# Patient Record
Sex: Male | Born: 1935 | Race: White | Hispanic: No | Marital: Married | Smoking: Former smoker
Health system: Southern US, Community
[De-identification: ages and names within clinical notes are randomized; demographics above are authoritative.]

## PROBLEM LIST (undated history)

## (undated) DIAGNOSIS — F419 Anxiety disorder, unspecified: Secondary | ICD-10-CM

## (undated) DIAGNOSIS — F0280 Dementia in other diseases classified elsewhere without behavioral disturbance: Secondary | ICD-10-CM

## (undated) DIAGNOSIS — E785 Hyperlipidemia, unspecified: Secondary | ICD-10-CM

## (undated) DIAGNOSIS — M549 Dorsalgia, unspecified: Secondary | ICD-10-CM

## (undated) DIAGNOSIS — T7840XA Allergy, unspecified, initial encounter: Secondary | ICD-10-CM

## (undated) DIAGNOSIS — I6389 Other cerebral infarction: Secondary | ICD-10-CM

## (undated) DIAGNOSIS — I10 Essential (primary) hypertension: Secondary | ICD-10-CM

## (undated) DIAGNOSIS — R42 Dizziness and giddiness: Secondary | ICD-10-CM

## (undated) DIAGNOSIS — G8929 Other chronic pain: Secondary | ICD-10-CM

## (undated) DIAGNOSIS — G309 Alzheimer's disease, unspecified: Secondary | ICD-10-CM

## (undated) HISTORY — DX: Allergy, unspecified, initial encounter: T78.40XA

## (undated) HISTORY — DX: Dorsalgia, unspecified: M54.9

## (undated) HISTORY — DX: Dizziness and giddiness: R42

## (undated) HISTORY — PX: CERVICAL DISC SURGERY: SHX588

## (undated) HISTORY — DX: Alzheimer's disease, unspecified: G30.9

## (undated) HISTORY — DX: Other cerebral infarction: I63.89

## (undated) HISTORY — DX: Hyperlipidemia, unspecified: E78.5

## (undated) HISTORY — DX: Essential (primary) hypertension: I10

## (undated) HISTORY — DX: Other chronic pain: G89.29

## (undated) HISTORY — DX: Anxiety disorder, unspecified: F41.9

## (undated) HISTORY — DX: Dementia in other diseases classified elsewhere, unspecified severity, without behavioral disturbance, psychotic disturbance, mood disturbance, and anxiety: F02.80

---

## 1998-11-15 ENCOUNTER — Encounter: Payer: Self-pay | Admitting: Internal Medicine

## 2002-01-14 ENCOUNTER — Encounter: Payer: Self-pay | Admitting: Internal Medicine

## 2003-10-16 ENCOUNTER — Encounter: Payer: Self-pay | Admitting: Internal Medicine

## 2004-06-26 ENCOUNTER — Ambulatory Visit: Payer: Self-pay | Admitting: Internal Medicine

## 2004-08-16 ENCOUNTER — Ambulatory Visit: Payer: Self-pay | Admitting: Internal Medicine

## 2005-02-18 ENCOUNTER — Ambulatory Visit: Payer: Self-pay | Admitting: Internal Medicine

## 2005-06-11 ENCOUNTER — Ambulatory Visit: Payer: Self-pay | Admitting: Internal Medicine

## 2005-08-21 ENCOUNTER — Ambulatory Visit: Payer: Self-pay | Admitting: Internal Medicine

## 2006-02-18 ENCOUNTER — Ambulatory Visit: Payer: Self-pay | Admitting: Internal Medicine

## 2006-02-24 ENCOUNTER — Ambulatory Visit: Payer: Self-pay | Admitting: Internal Medicine

## 2006-06-02 ENCOUNTER — Ambulatory Visit: Payer: Self-pay | Admitting: Internal Medicine

## 2006-06-23 ENCOUNTER — Ambulatory Visit: Payer: Self-pay | Admitting: Internal Medicine

## 2006-07-07 ENCOUNTER — Ambulatory Visit: Payer: Self-pay | Admitting: Internal Medicine

## 2006-07-31 ENCOUNTER — Ambulatory Visit: Payer: Self-pay | Admitting: Internal Medicine

## 2006-08-21 ENCOUNTER — Ambulatory Visit: Payer: Self-pay | Admitting: Internal Medicine

## 2006-09-21 ENCOUNTER — Ambulatory Visit: Payer: Self-pay | Admitting: Internal Medicine

## 2006-11-23 ENCOUNTER — Ambulatory Visit: Payer: Self-pay | Admitting: Internal Medicine

## 2006-11-26 DIAGNOSIS — H409 Unspecified glaucoma: Secondary | ICD-10-CM | POA: Insufficient documentation

## 2006-11-26 DIAGNOSIS — E785 Hyperlipidemia, unspecified: Secondary | ICD-10-CM

## 2006-11-26 DIAGNOSIS — R42 Dizziness and giddiness: Secondary | ICD-10-CM

## 2006-11-26 DIAGNOSIS — I10 Essential (primary) hypertension: Secondary | ICD-10-CM | POA: Insufficient documentation

## 2006-11-26 DIAGNOSIS — J309 Allergic rhinitis, unspecified: Secondary | ICD-10-CM | POA: Insufficient documentation

## 2006-12-28 ENCOUNTER — Ambulatory Visit: Payer: Self-pay | Admitting: Internal Medicine

## 2007-01-25 ENCOUNTER — Inpatient Hospital Stay: Payer: Self-pay | Admitting: Internal Medicine

## 2007-01-25 ENCOUNTER — Other Ambulatory Visit: Payer: Self-pay

## 2007-01-28 ENCOUNTER — Encounter: Payer: Self-pay | Admitting: Internal Medicine

## 2007-02-02 ENCOUNTER — Ambulatory Visit: Payer: Self-pay | Admitting: Internal Medicine

## 2007-02-05 ENCOUNTER — Ambulatory Visit: Payer: Self-pay | Admitting: *Deleted

## 2007-02-10 ENCOUNTER — Encounter (INDEPENDENT_AMBULATORY_CARE_PROVIDER_SITE_OTHER): Payer: Self-pay | Admitting: *Deleted

## 2007-02-25 ENCOUNTER — Encounter (INDEPENDENT_AMBULATORY_CARE_PROVIDER_SITE_OTHER): Payer: Self-pay | Admitting: *Deleted

## 2007-03-03 ENCOUNTER — Ambulatory Visit: Payer: Self-pay | Admitting: Internal Medicine

## 2007-03-04 LAB — CONVERTED CEMR LAB
Albumin: 4.1 g/dL (ref 3.5–5.2)
CO2: 34 meq/L — ABNORMAL HIGH (ref 19–32)
Creatinine, Ser: 0.8 mg/dL (ref 0.4–1.5)
LDL Cholesterol: 125 mg/dL — ABNORMAL HIGH (ref 0–99)
Phosphorus: 3.5 mg/dL (ref 2.3–4.6)
Potassium: 4.7 meq/L (ref 3.5–5.1)
Sodium: 142 meq/L (ref 135–145)
Total CHOL/HDL Ratio: 4.2
Triglycerides: 99 mg/dL (ref 0–149)

## 2007-03-21 ENCOUNTER — Emergency Department: Payer: Self-pay | Admitting: Emergency Medicine

## 2007-03-21 ENCOUNTER — Other Ambulatory Visit: Payer: Self-pay

## 2007-03-23 ENCOUNTER — Ambulatory Visit: Payer: Self-pay | Admitting: Internal Medicine

## 2007-06-28 ENCOUNTER — Ambulatory Visit: Payer: Self-pay | Admitting: Internal Medicine

## 2007-06-29 ENCOUNTER — Telehealth (INDEPENDENT_AMBULATORY_CARE_PROVIDER_SITE_OTHER): Payer: Self-pay | Admitting: *Deleted

## 2007-06-30 LAB — CONVERTED CEMR LAB
ALT: 17 units/L (ref 0–53)
AST: 22 units/L (ref 0–37)
Alkaline Phosphatase: 89 units/L (ref 39–117)
Basophils Relative: 0 % (ref 0.0–1.0)
Chloride: 99 meq/L (ref 96–112)
Eosinophils Relative: 1.5 % (ref 0.0–5.0)
GFR calc Af Amer: 83 mL/min
Glucose, Bld: 117 mg/dL — ABNORMAL HIGH (ref 70–99)
HCT: 44.1 % (ref 39.0–52.0)
Phosphorus: 3.1 mg/dL (ref 2.3–4.6)
Platelets: 219 10*3/uL (ref 150–400)
Potassium: 4.5 meq/L (ref 3.5–5.1)
RBC: 4.83 M/uL (ref 4.22–5.81)
RDW: 12.7 % (ref 11.5–14.6)
Sodium: 137 meq/L (ref 135–145)
Total Bilirubin: 1 mg/dL (ref 0.3–1.2)
Total CHOL/HDL Ratio: 4.1
Total Protein: 8.2 g/dL (ref 6.0–8.3)
Triglycerides: 99 mg/dL (ref 0–149)
VLDL: 20 mg/dL (ref 0–40)
Vitamin B-12: 310 pg/mL (ref 211–911)
WBC: 4.7 10*3/uL (ref 4.5–10.5)

## 2007-07-13 ENCOUNTER — Telehealth (INDEPENDENT_AMBULATORY_CARE_PROVIDER_SITE_OTHER): Payer: Self-pay | Admitting: *Deleted

## 2007-07-14 ENCOUNTER — Ambulatory Visit: Payer: Self-pay | Admitting: Internal Medicine

## 2007-07-14 ENCOUNTER — Telehealth (INDEPENDENT_AMBULATORY_CARE_PROVIDER_SITE_OTHER): Payer: Self-pay | Admitting: *Deleted

## 2007-07-28 ENCOUNTER — Encounter: Payer: Self-pay | Admitting: Internal Medicine

## 2007-07-29 ENCOUNTER — Telehealth: Payer: Self-pay | Admitting: Internal Medicine

## 2007-08-02 ENCOUNTER — Ambulatory Visit: Payer: Self-pay | Admitting: Internal Medicine

## 2007-08-04 ENCOUNTER — Telehealth: Payer: Self-pay | Admitting: Internal Medicine

## 2007-08-26 ENCOUNTER — Telehealth: Payer: Self-pay | Admitting: Internal Medicine

## 2007-09-16 ENCOUNTER — Ambulatory Visit: Payer: Self-pay | Admitting: Internal Medicine

## 2007-11-08 ENCOUNTER — Encounter: Payer: Self-pay | Admitting: Internal Medicine

## 2007-12-31 ENCOUNTER — Encounter: Payer: Self-pay | Admitting: *Deleted

## 2007-12-31 ENCOUNTER — Encounter (INDEPENDENT_AMBULATORY_CARE_PROVIDER_SITE_OTHER): Payer: Self-pay | Admitting: *Deleted

## 2007-12-31 ENCOUNTER — Ambulatory Visit: Payer: Self-pay | Admitting: *Deleted

## 2008-01-11 ENCOUNTER — Ambulatory Visit: Payer: Self-pay | Admitting: Internal Medicine

## 2008-01-12 LAB — CONVERTED CEMR LAB
BUN: 20 mg/dL (ref 6–23)
Bilirubin, Direct: 0.1 mg/dL (ref 0.0–0.3)
Chloride: 104 meq/L (ref 96–112)
Eosinophils Absolute: 0.3 10*3/uL (ref 0.0–0.7)
Glucose, Bld: 98 mg/dL (ref 70–99)
HCT: 42 % (ref 39.0–52.0)
HDL: 45.7 mg/dL (ref >39.0)
Monocytes Absolute: 0.6 10*3/uL (ref 0.1–1.0)
Monocytes Relative: 8.7 % (ref 3.0–12.0)
Platelets: 247 10*3/uL (ref 150–400)
Potassium: 4.2 meq/L (ref 3.5–5.1)
RDW: 12.3 % (ref 11.5–14.6)
TSH: 2.68 microintl units/mL (ref 0.35–5.50)
Total Bilirubin: 1.2 mg/dL (ref 0.3–1.2)
Total CHOL/HDL Ratio: 3.7
Triglycerides: 116 mg/dL (ref 0–149)

## 2008-01-24 ENCOUNTER — Encounter: Payer: Self-pay | Admitting: Internal Medicine

## 2008-01-25 DIAGNOSIS — F411 Generalized anxiety disorder: Secondary | ICD-10-CM | POA: Insufficient documentation

## 2008-01-25 DIAGNOSIS — I6350 Cerebral infarction due to unspecified occlusion or stenosis of unspecified cerebral artery: Secondary | ICD-10-CM | POA: Insufficient documentation

## 2008-04-26 ENCOUNTER — Ambulatory Visit: Payer: Self-pay | Admitting: Internal Medicine

## 2008-04-26 ENCOUNTER — Encounter: Payer: Self-pay | Admitting: Internal Medicine

## 2008-06-23 ENCOUNTER — Ambulatory Visit: Payer: Self-pay | Admitting: Internal Medicine

## 2008-06-23 DIAGNOSIS — R413 Other amnesia: Secondary | ICD-10-CM

## 2008-06-26 LAB — CONVERTED CEMR LAB
ALT: 23 units/L (ref 0–53)
BUN: 20 mg/dL (ref 6–23)
Bilirubin, Direct: 0.1 mg/dL (ref 0.0–0.3)
Calcium: 9.5 mg/dL (ref 8.4–10.5)
Eosinophils Relative: 3.9 % (ref 0.0–5.0)
GFR calc Af Amer: 93 mL/min
HCT: 42.1 % (ref 39.0–52.0)
Hemoglobin: 14.6 g/dL (ref 13.0–17.0)
Monocytes Absolute: 0.5 10*3/uL (ref 0.1–1.0)
Monocytes Relative: 7.2 % (ref 3.0–12.0)
Neutro Abs: 4.6 10*3/uL (ref 1.4–7.7)
Phosphorus: 3.3 mg/dL (ref 2.3–4.6)
Potassium: 4.4 meq/L (ref 3.5–5.1)
Sodium: 143 meq/L (ref 135–145)
TSH: 2.5 microintl units/mL (ref 0.35–5.50)
Total Protein: 8 g/dL (ref 6.0–8.3)
WBC: 7.1 10*3/uL (ref 4.5–10.5)

## 2008-07-03 ENCOUNTER — Telehealth: Payer: Self-pay | Admitting: Internal Medicine

## 2008-07-21 ENCOUNTER — Telehealth: Payer: Self-pay | Admitting: Internal Medicine

## 2008-07-25 ENCOUNTER — Ambulatory Visit: Payer: Self-pay | Admitting: Internal Medicine

## 2008-07-26 LAB — CONVERTED CEMR LAB
ALT: 29 units/L (ref 0–53)
AST: 27 units/L (ref 0–37)
Albumin: 4.1 g/dL (ref 3.5–5.2)
Alkaline Phosphatase: 79 units/L (ref 39–117)
BUN: 28 mg/dL — ABNORMAL HIGH (ref 6–23)
Basophils Relative: 1.1 % (ref 0.0–3.0)
Bilirubin, Direct: 0.1 mg/dL (ref 0.0–0.3)
Calcium: 9.7 mg/dL (ref 8.4–10.5)
Creatinine, Ser: 0.9 mg/dL (ref 0.4–1.5)
Eosinophils Absolute: 0.2 10*3/uL (ref 0.0–0.7)
Eosinophils Relative: 3.7 % (ref 0.0–5.0)
GFR calc Af Amer: 105 mL/min
GFR calc non Af Amer: 87 mL/min
Lymphocytes Relative: 22.4 % (ref 12.0–46.0)
MCV: 91.5 fL (ref 78.0–100.0)
Monocytes Relative: 8.5 % (ref 3.0–12.0)
Neutrophils Relative %: 64.3 % (ref 43.0–77.0)
RBC: 4.43 M/uL (ref 4.22–5.81)
Total CHOL/HDL Ratio: 3.7
Total Protein: 7.5 g/dL (ref 6.0–8.3)
VLDL: 16 mg/dL (ref 0–40)
WBC: 6.6 10*3/uL (ref 4.5–10.5)

## 2008-08-31 ENCOUNTER — Encounter: Payer: Self-pay | Admitting: Internal Medicine

## 2009-01-04 ENCOUNTER — Encounter: Payer: Self-pay | Admitting: Internal Medicine

## 2009-01-04 ENCOUNTER — Ambulatory Visit: Payer: Self-pay | Admitting: Internal Medicine

## 2009-01-04 DIAGNOSIS — I499 Cardiac arrhythmia, unspecified: Secondary | ICD-10-CM | POA: Insufficient documentation

## 2009-01-11 ENCOUNTER — Ambulatory Visit: Payer: Self-pay | Admitting: Internal Medicine

## 2009-01-30 ENCOUNTER — Encounter: Payer: Self-pay | Admitting: Internal Medicine

## 2009-05-03 ENCOUNTER — Encounter: Payer: Self-pay | Admitting: Internal Medicine

## 2009-05-03 ENCOUNTER — Ambulatory Visit: Payer: Self-pay | Admitting: Internal Medicine

## 2009-05-14 ENCOUNTER — Encounter: Payer: Self-pay | Admitting: Internal Medicine

## 2009-07-11 ENCOUNTER — Encounter: Payer: Self-pay | Admitting: Internal Medicine

## 2010-01-21 ENCOUNTER — Ambulatory Visit: Payer: Self-pay | Admitting: Internal Medicine

## 2010-01-23 LAB — CONVERTED CEMR LAB
AST: 27 units/L (ref 0–37)
Albumin: 4.3 g/dL (ref 3.5–5.2)
Alkaline Phosphatase: 84 units/L (ref 39–117)
Basophils Relative: 0.4 % (ref 0.0–3.0)
CO2: 26 meq/L (ref 19–32)
Calcium: 9.6 mg/dL (ref 8.4–10.5)
Chloride: 106 meq/L (ref 96–112)
Creatinine, Ser: 1.2 mg/dL (ref 0.4–1.5)
Eosinophils Absolute: 0.1 10*3/uL (ref 0.0–0.7)
Eosinophils Relative: 0.6 % (ref 0.0–5.0)
Glucose, Bld: 123 mg/dL — ABNORMAL HIGH (ref 70–99)
HCT: 42.1 % (ref 39.0–52.0)
LDL Cholesterol: 127 mg/dL — ABNORMAL HIGH (ref 0–99)
Lymphs Abs: 1.3 10*3/uL (ref 0.7–4.0)
MCHC: 34.2 g/dL (ref 30.0–36.0)
MCV: 92.5 fL (ref 78.0–100.0)
Monocytes Absolute: 0.5 10*3/uL (ref 0.1–1.0)
Neutrophils Relative %: 79.2 % — ABNORMAL HIGH (ref 43.0–77.0)
RBC: 4.55 M/uL (ref 4.22–5.81)
Sodium: 142 meq/L (ref 135–145)
Total Bilirubin: 0.7 mg/dL (ref 0.3–1.2)
Total CHOL/HDL Ratio: 4
WBC: 9.5 10*3/uL (ref 4.5–10.5)

## 2010-02-25 ENCOUNTER — Inpatient Hospital Stay: Payer: BLUE CROSS/BLUE SHIELD | Admitting: Internal Medicine

## 2010-02-25 ENCOUNTER — Encounter: Payer: Self-pay | Admitting: Internal Medicine

## 2010-02-25 ENCOUNTER — Ambulatory Visit: Payer: Self-pay | Admitting: Cardiovascular Disease

## 2010-02-26 ENCOUNTER — Encounter: Payer: Self-pay | Admitting: Internal Medicine

## 2010-03-06 ENCOUNTER — Ambulatory Visit: Payer: Self-pay | Admitting: Internal Medicine

## 2010-03-13 ENCOUNTER — Encounter: Payer: Self-pay | Admitting: Internal Medicine

## 2010-04-22 ENCOUNTER — Ambulatory Visit: Payer: Self-pay | Admitting: Internal Medicine

## 2010-04-22 DIAGNOSIS — F0280 Dementia in other diseases classified elsewhere without behavioral disturbance: Secondary | ICD-10-CM

## 2010-04-22 DIAGNOSIS — G309 Alzheimer's disease, unspecified: Secondary | ICD-10-CM

## 2010-05-27 ENCOUNTER — Ambulatory Visit: Payer: Self-pay | Admitting: Internal Medicine

## 2010-06-04 ENCOUNTER — Ambulatory Visit: Payer: Self-pay | Admitting: Family Medicine

## 2010-06-04 ENCOUNTER — Telehealth: Payer: Self-pay | Admitting: Internal Medicine

## 2010-06-05 ENCOUNTER — Encounter: Payer: Self-pay | Admitting: Internal Medicine

## 2010-06-05 ENCOUNTER — Telehealth: Payer: Self-pay | Admitting: Internal Medicine

## 2010-06-05 LAB — CONVERTED CEMR LAB
BUN: 28 mg/dL — ABNORMAL HIGH (ref 6–23)
CO2: 26 meq/L (ref 19–32)
Calcium: 9.8 mg/dL (ref 8.4–10.5)
Creatinine, Ser: 0.8 mg/dL (ref 0.4–1.5)
GFR calc non Af Amer: 97.43 mL/min (ref >60)
Glucose, Bld: 103 mg/dL — ABNORMAL HIGH (ref 70–99)

## 2010-06-12 ENCOUNTER — Telehealth (INDEPENDENT_AMBULATORY_CARE_PROVIDER_SITE_OTHER): Payer: Self-pay | Admitting: *Deleted

## 2010-07-01 ENCOUNTER — Ambulatory Visit: Payer: Self-pay | Admitting: Internal Medicine

## 2010-07-02 LAB — CONVERTED CEMR LAB
Albumin: 3.9 g/dL (ref 3.5–5.2)
BUN: 30 mg/dL — ABNORMAL HIGH (ref 6–23)
Creatinine, Ser: 0.9 mg/dL (ref 0.4–1.5)
GFR calc non Af Amer: 83.06 mL/min (ref >60)
Glucose, Bld: 124 mg/dL — ABNORMAL HIGH (ref 70–99)
Phosphorus: 2.3 mg/dL (ref 2.3–4.6)

## 2010-07-12 ENCOUNTER — Encounter: Payer: Self-pay | Admitting: Internal Medicine

## 2010-08-06 ENCOUNTER — Telehealth: Payer: Self-pay | Admitting: Internal Medicine

## 2010-09-10 NOTE — Progress Notes (Signed)
Summary: reporting BP readings  Phone Note Call from Patient Call back at Home Phone [phone]   Caller: Daughter  Gwenlyn Saran    Call For: Cindee Salt MD Summary of Call: Pt's daughter reports BP readings of 157/107, 129/85, 144/88, 138/79, 139/80 over the past week, starting with last wednesday.  She said he is taking his meds ok for now, they will keep a watch on that. Initial call taken by: Lowella Petties CMA, AAMA,  June 12, 2010 5:00 PM  Follow-up for Phone Call        Let her know that these are okay overall No need for any changes now Follow-up by: Cindee Salt MD,  June 12, 2010 5:32 PM  Additional Follow-up for Phone Call Additional follow up Details #1::        Left message on Debra's home voicemail to return call. Natasha Chavers CMA Duncan Dull)  June 13, 2010 11:15 AM I advised daughter Liane Comber CMA Duncan Dull)  June 14, 2010 9:30 AM'

## 2010-09-10 NOTE — Assessment & Plan Note (Signed)
Summary: DIZZY   Vital Signs:  Patient profile:   77 year old male Weight:      162.75 pounds Temp:     97.6 degrees F oral Pulse rate:   72 / minute Pulse (ortho):   80 / minute Pulse rhythm:   regular BP sitting:   180 / 100  (left arm) BP standing:   176 / 100 Cuff size:   large  Vitals Entered By: Selena Batten Dance CMA Duncan Dull) (June 04, 2010 10:01 AM)  Serial Vital Signs/Assessments:  Time      Position  BP       Pulse  Resp  Temp     By 10:12 AM  Lying LA  182/102  82                    Kim Dance CMA (AAMA) 10:12 AM  Sitting   180/100  80                    Kim Dance CMA (AAMA) 10:12 AM  Standing  176/100  80                    Kim Dance CMA (AAMA)  CC: Dizzy last PM   History of Present Illness: CC: dizzy  Presents with daughter Eunice Blase who helps give history (pt very hard of hearing).  2 nights ago having dinner, got very dizzy.  Placed in wheelchair, went to bed, vomited.  BP checked and 211 systolic.  No longer dizzy.  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  describes dizziness as vertigo.  No LOC, HA, vision changes, CP/tightness/SOB, slurred speech, one-sided weakness.  Lasted 35 min.  None since.  Daughter brings up concerns - Lives at [facility] independent living.  Daughter notes having trouble with showering and other ADLs.  Unsure if taking meds right (mezlicine and bp med mainly).  recently increased donepezil dose to 10 mg daily.  Receives weekly filled pill box from [facility], daughter states patient takes meds in am and pm, but she is unsure really what he is taking or has been wondering if he actually remembers to take them, or maybe taking too many.  No one at [facility] that can supervise [place] for med administration.  Wondering if may need to look into ALF, financial issues.  awaiting [employer] benefits in next few months.  Current Medications (verified): 1)  Donepezil Hcl 5 Mg Tabs (Donepezil Hcl) .Marland Kitchen.. 1 Tab Daily To  Help Memory 2)  Lisinopril 10 Mg Tabs (Lisinopril) .Marland Kitchen.. 1 Once Daily 3)  Lovastatin 40 Mg  Tabs (Lovastatin) .... Take 1 Tablet By Mouth Once A Day 4)  Sertraline Hcl 100 Mg Tabs (Sertraline Hcl) .Marland Kitchen.. 1 Daily 5)  Xalatan   Soln (Latanoprost Soln) .Marland Kitchen.. 1 Drop in Each Eye Once Daily 6)  Timolol Maleate 0.25 %  Soln (Timolol Maleate) .... Use One Drop in Each Eye Daily 7)  Aspirin Ec 81 Mg  Tbec (Aspirin) .... Take 1 Tab Daily. 8)  Antivert 25 Mg Tabs (Meclizine Hcl) .Marland Kitchen.. 1 Three Times A Day As Needed 9)  Donepezil Hcl 10 Mg Tabs (Donepezil Hcl) .Marland Kitchen.. 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  hard of hearing Head:  Normocephalic and atraumatic without obvious abnormalities. No apparent alopecia or balding. Eyes:  No corneal or conjunctival inflammation noted. EOMI. Perrla.  Ears:  External ear exam shows no significant lesions or deformities.  Otoscopic examination reveals clear canals, tympanic membranes are intact bilaterally without bulging, retraction, inflammation or discharge. Hearing is grossly normal bilaterally.  hearing aid in R ear, no hearing out of left ear Mouth:  Oral mucosa and oropharynx without lesions or exudates.  Teeth in good repair. Neck:  supple, no masses, no thyromegaly, and no cervical lymphadenopathy.  no bruits Lungs:  normal respiratory effort, no intercostal retractions, no accessory muscle use, and normal breath  sounds.   Heart:  normal rate, regular rhythm, no murmur, and no gallop.   Neurologic:  CN 2-12 intact x hearing, sensation and strength intact BUE/LE.   Skin:  no rashes and no suspicious lesions.     Impression & Recommendations:  Problem # 1:  VERTIGO (ICD-780.4) likely stemming from previous CVA, continue meclizine.  BP elevation liekly from acute episode of not feeling well.  However, remaining high.  see below.  no evidence of residual deficit to be concerned with new CVA  His updated medication list for this problem includes:    Antivert 25 Mg Tabs (Meclizine hcl) .Marland Kitchen... 1 three times a day as needed  Problem # 2:  HYPERTENSION (ICD-401.9) reviewing chart, has had elevated BPs in past.  today 180/100, doesn't drop.  will start HCTZ 12.5mg  daily as well as lisinopril.  hopeful for improvement, but dont' want to be too aggressive for control.  will defer to PCP.  asked daughter to keep log of BPs a few times a week over next few weeks and to call us next week with update.  asked to return in 2-3 wks for f/u, bring pill bottle with him.  His updated medication list for this problem includes:    Lisinopril 10 Mg Tabs (Lisinopril) .Marland Kitchen... 1 once daily    Hydrochlorothiazide 12.5 Mg Caps (Hydrochlorothiazide) .Marland Kitchen... Take one daily for blood pressure  BP today: 180/100 Prior BP: 158/80 (05/27/2010)  Labs Reviewed: K+: 4.0 (01/21/2010) Creat: : 1.2 (01/21/2010)   Chol: 196 (01/21/2010)   HDL: 48.40 (01/21/2010)   LDL: 127 (01/21/2010)   TG: 105.0 (01/21/2010)  Orders: TLB-BMP (Basic Metabolic Panel-BMET) (80048-METABOL)  Problem # 3:  ALZHEIMERS DISEASE (ICD-331.0) backed down to 5mg  dose given concerns about medication administration.  currently both on med list, removed 10mg .  family may very well need to reassess level of care.  will defer to PCP.  Complete Medication List: 1)  Donepezil Hcl 5 Mg Tabs (Donepezil hcl) .Marland Kitchen.. 1 tab daily to help memory 2)  Lisinopril 10 Mg Tabs  (Lisinopril) .Marland Kitchen.. 1 once daily 3)  Lovastatin 40 Mg Tabs (Lovastatin) .... Take 1 tablet by mouth once a day 4)  Sertraline Hcl 100 Mg Tabs (Sertraline hcl) .Marland Kitchen.. 1 daily 5)  Xalatan Soln (Latanoprost soln) .Marland Kitchen.. 1 drop in each eye once daily 6)  Timolol Maleate 0.25 % Soln (Timolol maleate) .... Use one drop in each eye daily 7)  Aspirin Ec 81 Mg Tbec (Aspirin) .... Take 1 tab daily. 8)  Antivert 25 Mg  Tabs (Meclizine hcl) .Marland Kitchen.. 1 three times a day as needed 9)  Hydrochlorothiazide 12.5 Mg Caps (Hydrochlorothiazide) .... Take one daily for blood pressure  Patient Instructions: 1)  Back down to 5mg  aricept (donepezil). 2)  Next time you come in, bring pills you are taking daily. 3)  Check blood pressure next week several times.  we want it less than 150/90.  Call us with results. 4)  we will start new medicine called hydrochlorothiazide 12.5mg  daily, take with lisinopril daily. Prescriptions: HYDROCHLOROTHIAZIDE 12.5 MG CAPS (HYDROCHLOROTHIAZIDE) take one daily for blood pressure  #30 x 3   Entered and Authorized by:   Eustaquio Boyden  MD   Signed by:   Eustaquio Boyden  MD on 06/04/2010   Method used:   Electronically to        [facility] [phone]* (retail)       [address] [place], [place]  [address]       Ph: [phone]       Fax: [phone]   RxID:   [id number]    Orders Added: 1)  TLB-BMP (Basic Metabolic Panel-BMET) [80048-METABOL] 2)  Est. Patient Level IV [38756]    Current Allergies (reviewed today): PENICILLIN G POTASSIUM (PENICILLIN G POTASSIUM) PAROXETINE HCL (PAROXETINE HCL)

## 2010-09-10 NOTE — Letter (Signed)
[facility]   Imported By: Beau Fanny 03/01/2010 15:22:46  _____________________________________________________________________  External Attachment:    Type:   Image     Comment:   External Document

## 2010-09-10 NOTE — Assessment & Plan Note (Signed)
Summary: HOSPITAL F/U [facility] DISCHARGED 7/19/DLO   Vital Signs:  Patient profile:   77 year old male Weight:      170.38 pounds Temp:     97.7 degrees F oral Pulse rate:   54 / minute Pulse rhythm:   regular BP sitting:   180 / 86  (left arm) Cuff size:   regular  Vitals Entered By: Janee Morn CMA (March 06, 2010 12:38 PM) CC: Hospiptal follow up   History of Present Illness: Hospitalized with dizziness and ataxia work up was negative feeling fine now  Doing well Stll happy at [facility] Still drives but very little---daughter drove him here today Most he does is [employer] down the road  No new concerns  No chest pain  No headaches No edema No trouble breathing  Daughter concerned about memory  Voiding okay no trouble with bowels  Allergies: 1)  Penicillin G Potassium (Penicillin G Potassium) 2)  Paroxetine Hcl (Paroxetine Hcl)  Past History:  Past medical, surgical, family and social histories (including risk factors) reviewed for relevance to current acute and chronic problems.  Past Medical History: Reviewed history from 06/23/2008 and no changes required. 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 1970 Pilonidal cyst 1975 Diskectomy  [place] vertigo / dizziness  12/2001 9/00 Brainstem CVA--vertigo  Family History: Reviewed history from 11/26/2006 and no changes required. Dad died @64  ??prostate cancer Mom died @81  respiratory arrest No CAD  Social History: Reviewed history from 08/02/2007 and no changes required. 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     Neck:  supple, no masses, no thyromegaly, and no cervical lymphadenopathy.   Lungs:  normal respiratory effort, no intercostal retractions, no accessory muscle use, and normal breath sounds.   Heart:  normal rate, regular rhythm, no murmur, and no gallop.   Extremities:  no edema Psych:  normally interactive, good eye contact, not anxious appearing, and not depressed appearing.     Impression & Recommendations:  Problem # 1:  VERTIGO (ICD-780.4) Assessment Deteriorated  recent hospitalization and marked worsening of hearing will have ENT eval  His updated medication list for this problem includes:    Antivert 25 Mg Tabs (Meclizine hcl) .Marland Kitchen... 1 three times a day as needed  Orders: ENT Referral (ENT)  Problem # 2:  HYPERTENSION (ICD-401.9) Assessment: Unchanged up some today recheck on right 162/106 will recheck this at follow up  The following medications were removed from the medication list:    Amlodipine Besylate 5 Mg Tabs (Amlodipine besylate) .Marland Kitchen... 1 daily His updated medication list for this problem includes:    Lisinopril 10 Mg Tabs (Lisinopril) .Marland Kitchen... 1 once daily  BP today: 180/86 Prior BP: 160/90 (01/21/2010)  Labs Reviewed: K+: 4.0 (01/21/2010) Creat: : 1.2 (01/21/2010)   Chol: 196 (01/21/2010)   HDL: 48.40 (01/21/2010)   LDL: 127 (01/21/2010)   TG: 105.0 (01/21/2010)  Problem # 3:  MEMORY LOSS (ICD-780.93) Assessment: Comment Only daughter concerned about worsened memory loss may be related to worsened hearing now he seems his usual apporpriate self except for hearing daughter is concerned  about him not allowing her to wash his clothes, etc discussed  that it might be good to go back to [facility]  Complete Medication List: 1)  Mevacor 40 Mg Tabs (Lovastatin) .Marland Kitchen.. 1 once daily 2)  Lisinopril 10 Mg Tabs (Lisinopril) .Marland Kitchen.. 1 once daily 3)  Lovastatin 40 Mg Tabs (Lovastatin) .... Take 1 tablet by mouth once a day 4)  Sertraline Hcl 100 Mg Tabs (Sertraline  hcl) .Marland Kitchen.. 1 daily 5)  Xalatan Soln (Latanoprost soln) .Marland Kitchen.. 1 gtt ou daily 6)  Timolol Maleate 0.25 % Soln (Timolol maleate) .... Use one drop in each eye daily 7)  Aspirin Ec 81 Mg Tbec (Aspirin) .... Take 1 tab daily. 8)  Antivert 25 Mg Tabs (Meclizine hcl) .Marland Kitchen.. 1 three times a day as needed  Patient Instructions: 1)  Please schedule a follow-up appointment in 4-6  weeks.   Current Allergies (reviewed today): PENICILLIN G POTASSIUM (PENICILLIN G POTASSIUM) PAROXETINE HCL (PAROXETINE HCL)

## 2010-09-10 NOTE — Letter (Signed)
Summary: Paperwork for [employer]   Paperwork for [employer]   Imported By: Lanelle Bal 06/12/2010 10:44:42  _____________________________________________________________________  External Attachment:    Type:   Image     Comment:   External Document

## 2010-09-10 NOTE — Progress Notes (Signed)
Summary: wants order for walker  Phone Note Call from Patient Call back at Home Phone [phone]   Caller: Daughter  Stanton Kidney Summary of Call: Pt wants an order for a walker with a seat and brakes sent to [facility].  Order must include diagnosis code.  Please let pt's daughter know when done. Initial call taken by: Lowella Petties CMA, AAMA,  June 04, 2010 4:50 PM  Follow-up for Phone Call        Rx written Follow-up by: Cindee Salt MD,  June 05, 2010 7:58 AM  Additional Follow-up for Phone Call Additional follow up Details #1::        order faxed to [facility], left message on daughter's voicemail that order was faxed. Additional Follow-up by: Mervin Hack CMA Duncan Dull),  June 05, 2010 8:48 AM

## 2010-09-10 NOTE — Letter (Signed)
Summary: [facility]   [facility]   Imported By: Maryln Gottron 03/26/2010 15:05:28  _____________________________________________________________________  External Attachment:    Type:   Image     Comment:   External Document  Appended Document: [facility]  hearing in left ear is gone cerumen cleaned in right and will continue aid in that ear

## 2010-09-10 NOTE — Assessment & Plan Note (Signed)
Summary: F/U DLO   Vital Signs:  Patient profile:   77 year old male Weight:      165 pounds Temp:     97.7 degrees F oral Pulse rate:   68 / minute Pulse rhythm:   regular BP sitting:   158 / 80  (left arm) Cuff size:   large  Vitals Entered By: Mervin Hack CMA Duncan Dull) (May 27, 2010 11:48 AM) CC: follow-up visit   History of Present Illness: DOing fine In with daughter again No apparent problems with the donepezil He thinks it may have helped some Now using calendar at home to help with appts  Daughter notes some improvement He only started working on his memory (like using calendar) when he started it  maintains his independence at [facility] gets all three meals there needs shower chair for shower---daughter not sure how well he is doing  Allergies: 1)  Penicillin G Potassium (Penicillin G Potassium) 2)  Paroxetine Hcl (Paroxetine Hcl)  Past History:  Past medical, surgical, family and social histories (including risk factors) reviewed for relevance to current acute and chronic problems.  Past Medical History: Reviewed history from 04/22/2010 and no changes required. 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 1970 Pilonidal cyst 1975 Diskectomy  [place] vertigo / dizziness  12/2001 9/00 Brainstem CVA--vertigo  Family History: Reviewed history from 11/26/2006 and no changes required. Dad died @64  ??prostate cancer Mom died @81  respiratory arrest No CAD  Social History: Reviewed history from 08/02/2007 and no changes required. 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   Psych:  normally interactive, good eye contact, not anxious appearing, and not depressed appearing.     Impression & Recommendations:  Problem # 1:  ALZHEIMERS DISEASE (ICD-331.0) Assessment Improved may have slight improvement per him and duaghter no side effects will try the 10mg  dose---daughter will call if any problems  Complete Medication List: 1)  Donepezil Hcl 5 Mg Tabs (Donepezil hcl) .Marland Kitchen.. 1 tab daily to help memory 2)  Lisinopril 10 Mg Tabs (Lisinopril) .Marland Kitchen.. 1 once daily 3)  Lovastatin 40 Mg Tabs (Lovastatin) .... Take 1 tablet by mouth once a day 4)  Sertraline Hcl 100 Mg Tabs (Sertraline hcl) .Marland Kitchen.. 1 daily 5)  Xalatan Soln (Latanoprost soln) .Marland Kitchen.. 1 drop in each eye once daily 6)  Timolol Maleate 0.25 % Soln (Timolol maleate) .... Use one drop in each eye daily 7)  Aspirin Ec 81 Mg Tbec (Aspirin) .... Take 1 tab daily. 8)  Antivert 25 Mg Tabs (Meclizine hcl) .Marland Kitchen.. 1 three times a day as needed 9)  Donepezil Hcl 10 Mg Tabs (Donepezil hcl) .Marland Kitchen.. 1 tab daily to help memory  Patient Instructions: 1)  Please increase the donepezil to 10mg  daily. If you have any side effects, please go back to the 5mg  daily dose 2)  Please schedule a follow-up appointment in 4 months .  Prescriptions: DONEPEZIL HCL 10 MG TABS (DONEPEZIL HCL) 1 tab daily to help memory  #30 x 11   Entered and Authorized by:   Cindee Salt MD   Signed by:   Ronnette Hila  Alphonsus Sias MD on 05/27/2010   Method used:   Electronically to        [facility] [phone]* (retail)       [address] [place], [place]  [address]       Ph: [phone]       Fax: [phone]   RxID:   [id number]    Orders Added: 1)  Est. Patient Level III [60737]    Current Allergies (reviewed today): PENICILLIN G POTASSIUM (PENICILLIN G POTASSIUM) PAROXETINE HCL (PAROXETINE HCL)

## 2010-09-10 NOTE — Progress Notes (Signed)
Summary: forms for walker  Phone Note From Pharmacy   Caller: [facility] [phone]* Summary of Call: Forms need to be completed for walker, forms are on your desk. Initial call taken by: Lowella Petties CMA, AAMA,  June 05, 2010 12:21 PM  Follow-up for Phone Call        forms done Please send Cindee Salt MD  June 05, 2010 1:37 PM   forms re-sent and scanned into chart Follow-up by: Mervin Hack CMA Duncan Dull),  June 05, 2010 3:12 PM

## 2010-09-10 NOTE — Letter (Signed)
Summary: Physician Eval for aid  Physician Eval for aid   Imported By: Lester Lagro 07/18/2010 09:03:25  _____________________________________________________________________  External Attachment:    Type:   Image     Comment:   External Document

## 2010-09-10 NOTE — Assessment & Plan Note (Signed)
Summary: ROA FOR 4-6 WEEK FOLLOW-UP/JRR   Vital Signs:  Patient profile:   77 year old male Weight:      169 pounds Temp:     98.0 degrees F oral Pulse rate:   72 / minute Pulse rhythm:   regular BP sitting:   158 / 80  (left arm) Cuff size:   large  Vitals Entered By: Mervin Hack CMA Duncan Dull) (April 22, 2010 11:31 AM) CC: 4-6week follow-up   History of Present Illness: Here with daughter again he feels fine Lives at [facility] He doesn't see a problem--just feels things are mild   Daughter notes that he repetitively forgets about appts---like daughter had to tell him about this appt 5 times Car taken away --son selling it Duaghter concerned about the extent of his problems  Independent with bathroom and dressing Needs encouragement with bathing still able to go down to meals on his own  Allergies: 1)  Penicillin G Potassium (Penicillin G Potassium) 2)  Paroxetine Hcl (Paroxetine Hcl)  Past History:  Past medical, surgical, family and social histories (including risk factors) reviewed for relevance to current acute and chronic problems.  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 1970 Pilonidal cyst 1975 Diskectomy  Deaton vertigo / dizziness  12/2001 9/00 Brainstem CVA--vertigo  Family History: Reviewed history from 11/26/2006 and no changes required. Dad died @64  ??prostate cancer Mom died @81  respiratory arrest No CAD  Social History: Reviewed history from 08/02/2007 and no changes required. 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  NAD Neurologic:  Tuesday February, w[address] [facility] [facility] President--- "Ailene Ards,  I'm not a politician" [phone]  trying to do recall---couldn't even remember them correctly at first---changing pen to ten, table to cable even after repeated episodes   Impression & Recommendations:  Problem # 1:  ALZHEIMERS DISEASE (ICD-331.0) Assessment New clearly has the cognitive problems and loss of executive function counselled daughter for more than half of the 25 minute visit will need to consider getting him back into assisted living soon---having money issues though  will try donepezil and see if he improves will plan  ~6 week follow up  Problem # 2:  VERTIGO (ICD-780.4) Assessment: Improved seems to be less of an issue having trouble using as needed meds properly can probably try off it  His updated medication list for this problem includes:    Antivert 25 Mg Tabs (Meclizine hcl) .Marland Kitchen... 1 three times a day as needed  Problem # 3:  HYPERTENSION (ICD-401.9) Assessment: Unchanged BP okay  His updated medication list for this problem includes:    Lisinopril 10 Mg Tabs (Lisinopril) .Marland Kitchen... 1 once daily  BP today: 158/80 Prior BP: 180/86 (03/06/2010)  Labs Reviewed: K+: 4.0 (01/21/2010) Creat: : 1.2 (01/21/2010)   Chol: 196 (01/21/2010)   HDL: 48.40 (01/21/2010)   LDL: 127 (01/21/2010)   TG: 105.0 (01/21/2010)  Complete Medication List: 1)  Lisinopril 10 Mg Tabs (Lisinopril) .Marland Kitchen.. 1 once daily 2)  Lovastatin 40 Mg Tabs (Lovastatin) .... Take 1 tablet by mouth once a day 3)  Sertraline Hcl 100 Mg Tabs (Sertraline hcl) .Marland Kitchen.. 1 daily 4)  Xalatan Soln (Latanoprost soln) .Marland Kitchen.. 1 drop in each eye once daily 5)  Timolol Maleate 0.25 % Soln (Timolol maleate) .... Use  one drop in each eye daily 6)  Aspirin Ec 81 Mg Tbec (Aspirin) .... Take 1 tab daily. 7)  Antivert 25 Mg Tabs (Meclizine hcl) .Marland Kitchen.. 1 three times a day as needed 8)  Donepezil Hcl 5 Mg Tabs (Donepezil hcl) .Marland Kitchen.. 1 tab daily to help memory  Patient Instructions: 1)  Please try the donepezil daily to see if  it helps with memory 2)  Please schedule a follow-up appointment in 6 weeks.  Prescriptions: DONEPEZIL HCL 5 MG TABS (DONEPEZIL HCL) 1 tab daily to help memory  #30 x 11   Entered and Authorized by:   Cindee Salt MD   Signed by:   Cindee Salt MD on 04/22/2010   Method used:   Electronically to        [facility] [phone]* (retail)       [address] [place], [place]  [address]       Ph: [phone]       Fax: [phone]   RxID:   [id number]   Current Allergies (reviewed today): PENICILLIN G POTASSIUM (PENICILLIN G POTASSIUM) PAROXETINE HCL (PAROXETINE HCL)  Appended Document: ROA FOR 4-6 WEEK FOLLOW-UP/JRR    Clinical Lists Changes  Orders: Added new Service order of Pneumococcal Vaccine (00867) - Signed Added new Service order of Admin 1st Vaccine (61950) - Signed Added new Service order of Admin 1st Vaccine [facility]) [phone]) - Signed Added new Service order of Flu Vaccine 51yrs + (24580) - Signed Added new Service order of Admin of Any Addtl Vaccine (99833) - Signed Added new Service order of Admin of Any Addtl Vaccine (State) [phone]) - Signed Observations: Added new observation of FLU VAX#1VIS: 03/05/10 version given April 22, 2010. (04/22/2010 12:16) Added new observation of FLU VAXLOT: [id number] (04/22/2010 12:16) Added new observation of FLU VAX EXP: 02/08/2011 (04/22/2010 12:16) Added new observation of FLU VAXBY: DeShannon Smith CMA (AAMA) (04/22/2010 12:16) Added new observation of FLU VAXRTE: IM (04/22/2010 12:16) Added new observation of FLU VAX DSE: 0.5 ml (04/22/2010 12:16) Added new observation of FLU VAXMFR: GlaxoSmithKline (04/22/2010 12:16) Added new observation of FLU VAX SITE: left deltoid (04/22/2010 12:16) Added new observation of FLU VAX: Fluvax 3+ (04/22/2010 12:16) Added new observation of PNEUMOVAXVIS: 07/16/09 version given April 22, 2010. (04/22/2010 12:16) Added new observation of PNEUMOVAXLOT: [id number]  (04/22/2010 12:16) Added new observation of PNEUMOVAXEXP: 10/16/2011 (04/22/2010 12:16) Added new observation of PNEUMOVAXBY: DeShannon Smith CMA (AAMA) (04/22/2010 12:16) Added new observation of PNEUMOVAXRTE: IM (04/22/2010 12:16) Added new observation of PNEUMOVAXMFR: Merck (04/22/2010 12:16) Added new observation of PNEUMOVAXSIT: right deltoid (04/22/2010 12:16) Added new observation of PNEUMOVAX: Pneumovax (04/22/2010 12:16)       Pneumovax Vaccine    Vaccine Type: Pneumovax    Site: right deltoid    Mfr: Merck    Dose: 0.5 ml    Route: IM    Given by: Mervin Hack CMA (AAMA)    Exp. Date: 10/16/2011    Lot #: [id number]    VIS given: 07/16/09 version given April 22, 2010.  Influenza Vaccine    Vaccine Type: Fluvax 3+    Site: left deltoid    Mfr: GlaxoSmithKline    Dose: 0.5 ml    Route: IM    Given by: Mervin Hack CMA (AAMA)    Exp. Date: 02/08/2011    Lot #: [id number]    VIS given: 03/05/10 version given April 22, 2010.  Flu Vaccine Consent Questions    Do  you have a history of severe allergic reactions to this vaccine? no    Any prior history of allergic reactions to egg and/or gelatin? no    Do you have a sensitivity to the preservative Thimersol? no    Do you have a past history of Guillan-Barre Syndrome? no    Do you currently have an acute febrile illness? no    Have you ever had a severe reaction to latex? no    Vaccine information given and explained to patient? yes

## 2010-09-10 NOTE — Assessment & Plan Note (Signed)
Summary: F/U/CLE   Vital Signs:  Patient profile:   77 year old male Height:      71 inches Weight:      171 pounds BMI:     23.94 Temp:     98.0 degrees F oral Pulse rate:   76 / minute Pulse rhythm:   regular BP sitting:   160 / 90  (left arm) Cuff size:   large  Vitals Entered By: Mervin Hack CMA Duncan Dull) (January 21, 2010 3:13 PM) CC: follow-up visit   History of Present Illness: Moved from [facility] to [facility] about 5 months ago rates went up quite a bit  Happy with this Food is good he likes his neighbors More independence  Feels well--"I feel the best i have since my wife died" upset about weight gain does walk daily  No chest pain No SOB no edema no sig change in exercise tolerance no palpitations  No myalgias on statin  mild hand arthritis hasn't needed meds  Allergies: 1)  Penicillin G Potassium (Penicillin G Potassium) 2)  Paroxetine Hcl (Paroxetine Hcl)  Past History:  Past medical, surgical, family and social histories (including risk factors) reviewed for relevance to current acute and chronic problems.  Past Medical History: Reviewed history from 06/23/2008 and no changes required. 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 1970 Pilonidal cyst 1975 Diskectomy  Deaton vertigo / dizziness  12/2001 9/00 Brainstem CVA--vertigo  Family History: Reviewed history from 11/26/2006 and no changes required. Dad died @64  ??prostate cancer Mom died @81  respiratory arrest No CAD  Social History: Reviewed history from 08/02/2007 and no changes required. 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   Neck:  supple, no masses, no thyromegaly, no carotid bruits, and no cervical lymphadenopathy.   Lungs:  normal respiratory effort and normal breath sounds.   Heart:  normal rate, regular rhythm, no murmur, and no gallop.  Freq ectopy as in the past Abdomen:  soft and non-tender.   Pulses:  1+ IN FEET Extremities:  no edema Neurologic:  alert & oriented X3, strength normal in all extremities, and gait normal.   Psych:  normally interactive, good eye contact, not anxious appearing, and not depressed appearing.     Impression & Recommendations:  Problem # 1:  HYPERTENSION (ICD-401.9) Assessment Unchanged  reasonable control no changes needed  His updated medication list for this problem includes:    Amlodipine Besylate 5 Mg Tabs (Amlodipine besylate) .Marland Kitchen... 1 daily  BP today: 160/90 Prior BP: 144/80 (05/03/2009)  Labs Reviewed: K+: 4.2 (07/25/2008) Creat: : 0.9 (07/25/2008)   Chol: 167 (07/25/2008)   HDL: 45.7 (07/25/2008)   LDL: 105 (07/25/2008)   TG: 80 (07/25/2008)  Orders: TLB-Renal Function Panel (80069-RENAL) TLB-CBC Platelet - w/Differential (85025-CBCD) TLB-TSH (Thyroid Stimulating Hormone) (84443-TSH)  Problem # 2:  HYPERLIPIDEMIA (ICD-272.4) Assessment: Unchanged  due for labs will continue due to history of brainstem stroke  His updated medication list for this problem includes:    Lovastatin 40 Mg Tabs (Lovastatin) .Marland Kitchen... Take 1 tablet by mouth once a day  Labs Reviewed: SGOT: 27 (07/25/2008)   SGPT: 29 (07/25/2008)   HDL:45.7 (07/25/2008),  45.7 (01/11/2008)  LDL:105 (07/25/2008), 99 (81/19/1478)  Chol:167 (07/25/2008), 168 (01/11/2008)  Trig:80 (07/25/2008), 116 (01/11/2008)  Orders: TLB-Lipid Panel (80061-LIPID) TLB-Hepatic/Liver Function Pnl (80076-HEPATIC) Venipuncture (29562)  Problem # 3:  VERTIGO (ICD-780.4) Assessment: Improved no recent dizziness  Problem # 4:  ANXIETY (ICD-300.00) Assessment:  Improved mood has been good will continue med  His updated medication list for this problem includes:    Sertraline Hcl 100 Mg Tabs (Sertraline hcl) .Marland Kitchen... 1 daily  Complete Medication List: 1)  Lovastatin 40 Mg Tabs (Lovastatin) .... Take 1 tablet by mouth once a day 2)  Sertraline Hcl 100 Mg Tabs (Sertraline hcl) .Marland Kitchen.. 1 daily 3)  Amlodipine Besylate 5 Mg Tabs (Amlodipine besylate) .Marland Kitchen.. 1 daily 4)  Xalatan Soln (Latanoprost soln) .Marland Kitchen.. 1 gtt ou daily 5)  Timolol Maleate 0.25 % Soln (Timolol maleate) .... Use one drop in each eye daily 6)  Aspirin Ec 81 Mg Tbec (Aspirin) .... Take 1 tab daily.  Patient Instructions: 1)  Please schedule a follow-up appointment in 6 months .   Current Allergies (reviewed today): PENICILLIN G POTASSIUM (PENICILLIN G POTASSIUM) PAROXETINE HCL (PAROXETINE HCL)  Appended Document: [facility] home after 1 day tests for possible stroke negative BP sys 169 has appt next week

## 2010-09-10 NOTE — Assessment & Plan Note (Signed)
Summary: F/U PER DR LETVAK/CLE   Vital Signs:  Patient profile:   77 year old male Weight:      156 pounds Temp:     97.8 degrees F oral Pulse rate:   68 / minute Pulse rhythm:   regular BP sitting:   140 / 80  (left arm) Cuff size:   large  Vitals Entered By: Mervin Hack CMA Duncan Dull) (July 01, 2010 11:36 AM) CC: follow-up visit   History of Present Illness: Daughter feels his BP is better wtih the HCTZ May have not been getting the meds right [facility] comes in his meds morning and evening someone comes in to give him a bath weekly managing at [facility]  donepezil back to 5mg  not clear that higher dose did anymore good No clear if that could be related to increased BP  walks daily with cane or walker No chest pain  No SOB  Mood seems to be stable satisfied where he is  Allergies: 1)  Penicillin G Potassium (Penicillin G Potassium) 2)  Paroxetine Hcl (Paroxetine Hcl)  Past History:  Past medical, surgical, family and social histories (including risk factors) reviewed for relevance to current acute and chronic problems.  Past Medical History: Reviewed history from 04/22/2010 and no changes required. 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 1970 Pilonidal cyst 1975 Diskectomy  Deaton vertigo / dizziness  12/2001 9/00 Brainstem CVA--vertigo  Family History: Reviewed history from 11/26/2006 and no changes required. Dad died @64  ??prostate cancer Mom died @81  respiratory arrest No CAD  Social History: Reviewed history from 06/04/2010 and no changes required. 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   Neck:  supple, no masses, and no cervical lymphadenopathy.   Lungs:  normal respiratory effort, no intercostal retractions, no accessory muscle use, and normal breath sounds.   Heart:  normal rate, regular rhythm, no murmur, and no gallop.   Extremities:  no edema Neurologic:  gait is slightly wide based but no ataxia Psych:  normally interactive, good eye contact, not anxious appearing, and not depressed appearing.     Impression & Recommendations:  Problem # 1:  HYPERTENSION (ICD-401.9) Assessment Improved  will continue HCTZ check labs  His updated medication list for this problem includes:    Lisinopril 10 Mg Tabs (Lisinopril) .Marland Kitchen... 1 once daily    Hydrochlorothiazide 12.5 Mg Caps (Hydrochlorothiazide) .Marland Kitchen... Take one daily for blood pressure  BP today: 140/80 Prior BP: 176/100 (06/04/2010)  Labs Reviewed: K+: 4.6 (06/04/2010) Creat: : 0.8 (06/04/2010)   Chol: 196 (01/21/2010)   HDL: 48.40 (01/21/2010)   LDL: 127 (01/21/2010)   TG: 105.0 (01/21/2010)  Orders: Venipuncture (82956) TLB-Renal Function Panel (80069-RENAL)  Problem # 2:  ALZHEIMERS DISEASE (ICD-331.0) Assessment: Unchanged stable now on donepezil we will continue the 5mg  dose  Problem # 3:  ANXIETY (ICD-300.00) Assessment: Comment Only and some depression in past doing well on Rx  His updated medication list for this problem includes:    Sertraline Hcl 100 Mg Tabs (Sertraline hcl) .Marland Kitchen... 1 daily  Complete Medication List: 1)  Donepezil Hcl 5 Mg Tabs (Donepezil hcl) .Marland Kitchen.. 1 tab daily  to help memory 2)  Lisinopril 10 Mg Tabs (Lisinopril) .Marland Kitchen.. 1 once daily 3)  Lovastatin 40 Mg Tabs (Lovastatin) .... Take 1 tablet by mouth once a day 4)  Sertraline Hcl 100 Mg Tabs (Sertraline hcl) .Marland Kitchen.. 1 daily 5)  Xalatan Soln (Latanoprost soln) .Marland Kitchen.. 1 drop in each eye once daily 6)  Timolol Maleate 0.25 % Soln (Timolol maleate) .... Use one drop  in each eye daily 7)  Aspirin Ec 81 Mg Tbec (Aspirin) .... Take 1 tab daily. 8)  Antivert 25 Mg Tabs (Meclizine hcl) .Marland Kitchen.. 1 three times a day as needed 9)  Hydrochlorothiazide 12.5 Mg Caps (Hydrochlorothiazide) .... Take one daily for blood pressure  Patient Instructions: 1)  Please schedule a follow-up appointment in 3 months .    Orders Added: 1)  Venipuncture [36415] 2)  TLB-Renal Function Panel [80069-RENAL] 3)  Est. Patient Level IV [91478]    Current Allergies (reviewed today): PENICILLIN G POTASSIUM (PENICILLIN G POTASSIUM) PAROXETINE HCL (PAROXETINE HCL)

## 2010-09-10 NOTE — Letter (Signed)
Summary: [facility]   Imported By: Beau Fanny 03/06/2010 14:55:14  _____________________________________________________________________  External Attachment:    Type:   Image     Comment:   External Document

## 2010-09-12 NOTE — Progress Notes (Signed)
Summary: Rx Sertraline  Phone Note Refill Request Call back at [phone] Message from:  Medicap on August 06, 2010 10:50 AM  Refills Requested: Medication #1:  SERTRALINE HCL 100 MG TABS 1 daily   Last Refilled: 06/25/2010 Received faxed refill request please advise.   Method Requested: Fax to Local Pharmacy Initial call taken by: Linde Gillis CMA Duncan Dull),  August 06, 2010 10:51 AM  Follow-up for Phone Call        Rx completed in Dr. Tiajuana Amass Follow-up by: Cindee Salt MD,  August 06, 2010 1:56 PM    Prescriptions: SERTRALINE HCL 100 MG TABS (SERTRALINE HCL) 1 daily  #30 x 11   Entered and Authorized by:   Cindee Salt MD   Signed by:   Cindee Salt MD on 08/06/2010   Method used:   Electronically to        [facility] [phone]* (retail)       [address] [place], [place]  [address]       Ph: [phone]       Fax: [phone]   RxID:   [id number]

## 2010-10-01 ENCOUNTER — Encounter: Payer: Self-pay | Admitting: Internal Medicine

## 2010-10-01 ENCOUNTER — Ambulatory Visit (INDEPENDENT_AMBULATORY_CARE_PROVIDER_SITE_OTHER): Payer: Medicare Other | Admitting: Internal Medicine

## 2010-10-01 DIAGNOSIS — F411 Generalized anxiety disorder: Secondary | ICD-10-CM

## 2010-10-01 DIAGNOSIS — G309 Alzheimer's disease, unspecified: Secondary | ICD-10-CM

## 2010-10-01 DIAGNOSIS — I10 Essential (primary) hypertension: Secondary | ICD-10-CM

## 2010-10-08 NOTE — Assessment & Plan Note (Signed)
Summary: F/U/CLE   Vital Signs:  Patient profile:   77 year old male Height:      71 inches Weight:      157 pounds BMI:     21.98 Temp:     98.6 degrees F oral Pulse rate:   66 / minute Pulse rhythm:   regular BP sitting:   120 / 84  (left arm) Cuff size:   large  Vitals Entered By: Melody Comas (October 01, 2010 10:59 AM) CC: follow-up visit   History of Present Illness: Here with daughter again  "I feel the best I have in a long time" Still doing okay at [facility] Still has [facility] to help with meds, etc---son sets up in lock box and aides administer them  Daughter notes a progression of the memory problems poor reasoning skills Confabulates at times--- thinks furniture is coming that isn't, etc Still remembers family members No concerns there All meals provided Daughter does the laundry and snacks They clean weekly there  No chest pain NO SOB No edema No headaches  No joint pain back has been fine Uses rolling walker in facility or cane  Allergies: 1)  Penicillin G Potassium (Penicillin G Potassium) 2)  Paroxetine Hcl  Past History:  Past medical, surgical, family and social histories (including risk factors) reviewed for relevance to current acute and chronic problems.  Past Medical History: Reviewed history from 04/22/2010 and no changes required. 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 1970 Pilonidal cyst 1975 Diskectomy  Deaton vertigo / dizziness  12/2001 9/00 Brainstem CVA--vertigo  Family History: Reviewed history from 11/26/2006 and no changes required. Dad died @64  ??prostate cancer Mom died @81  respiratory arrest No CAD  Social History: Reviewed history from 06/04/2010 and no changes required. 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   Neck:  supple, no masses, no thyromegaly, and no cervical lymphadenopathy.   Lungs:  normal respiratory effort, no intercostal retractions, no accessory muscle use, and normal breath sounds.   Heart:  normal rate, regular rhythm, no murmur, and no gallop.   Extremities:  no edema Neurologic:  mild psychomotor agitation---thumping on chair during visit Gait normal with cane--just slow Psych:  not anxious appearing and not depressed appearing.  Limited volunteering of speech---seems more related to poor hearing than cognition   Impression & Recommendations:  Problem # 1:  ALZHEIMERS DISEASE (ICD-331.0) Assessment Deteriorated slow progression okay still in [place]---discussed changing to dementia unit vs. waiting and then proceeding to skilled care  Problem # 2:  HYPERTENSION (ICD-401.9) Assessment: Unchanged good control no changes needed  His updated medication list for this problem includes:    Lisinopril 10 Mg Tabs (Lisinopril) .Marland Kitchen... 1 once daily    Hydrochlorothiazide 12.5 Mg Caps (Hydrochlorothiazide) .Marland Kitchen... Take one daily for blood pressure  BP today: 120/84 Prior BP: 140/80 (07/01/2010)  Labs Reviewed: K+: 4.0 (07/01/2010) Creat: : 0.9 (07/01/2010)   Chol: 196 (01/21/2010)   HDL: 48.40 (01/21/2010)   LDL: 127 (01/21/2010)   TG: 105.0 (01/21/2010)  Problem # 3:  ANXIETY (ICD-300.00) Assessment: Unchanged seems controlled in general mild anxiety here but he is uneasy??  His updated medication list for this problem includes:    Sertraline Hcl 100 Mg Tabs (Sertraline hcl) .Marland Kitchen... 1 daily  Complete Medication List: 1)  Donepezil Hcl 5 Mg Tabs (Donepezil hcl) .Marland Kitchen.. 1 tab daily to help memory 2)  Lisinopril 10 Mg Tabs (Lisinopril) .Marland Kitchen.. 1  once daily 3)  Lovastatin 40 Mg Tabs (Lovastatin) .... Take 1 tablet by mouth once a day 4)  Sertraline Hcl 100 Mg Tabs (Sertraline hcl) .Marland Kitchen.. 1 daily 5)  Xalatan Soln (Latanoprost soln) .Marland Kitchen.. 1 drop in each eye once daily 6)  Timolol Maleate 0.25 % Soln (Timolol maleate) .... Use one drop in each eye daily 7)  Aspirin Ec 81 Mg Tbec (Aspirin) .... Take 1 tab daily. 8)  Antivert 25 Mg Tabs (Meclizine hcl) .Marland Kitchen.. 1 three times a day as needed 9)  Hydrochlorothiazide 12.5 Mg Caps (Hydrochlorothiazide) .... Take one daily for blood pressure  Patient Instructions: 1)  Please schedule a follow-up appointment in 4 months .    Orders Added: 1)  Est. Patient Level IV [04540]    Current Allergies (reviewed today): PENICILLIN G POTASSIUM (PENICILLIN G POTASSIUM) PAROXETINE HCL

## 2010-10-16 ENCOUNTER — Telehealth: Payer: Self-pay | Admitting: Family Medicine

## 2010-10-22 NOTE — Progress Notes (Signed)
Summary: pt is dizzy  Phone Note Call from Patient Call back at [phone]   Caller: Son Virl Diamond Summary of Call: Pt's son states pt is dizzy this morning, had some nausea last night.  Pulse is 52, BP 170/72, no fever, no diarrhea.  Son says pt has these spells about twice a year, usually goes to ER and no reason is ever found.  He is going to watch pt today, give him food and fluids and his meclizine and see how he does.  If worse he will have pt go to ER. Initial call taken by: Lowella Petties CMA, AAMA,  October 16, 2010 9:53 AM  Follow-up for Phone Call        Agreed.  thank you. Ruthe Mannan MD  October 16, 2010 10:07 AM

## 2010-12-10 ENCOUNTER — Other Ambulatory Visit: Payer: Self-pay | Admitting: *Deleted

## 2010-12-10 MED ORDER — ASPIRIN 81 MG PO TABS: 81.0000 mg | ORAL_TABLET | Freq: Every day | ORAL | Status: AC

## 2010-12-24 NOTE — Assessment & Plan Note (Signed)
[facility]                                 ON-CALL NOTE   BUCK, MCAFFEE                       MRN:          [id number]  DATE:03/21/2007                            DOB:          03-Nov-1927    PRIMARY CARE PHYSICIAN:  Dr. Catalina Antigua from [facility] calls in stating that Mr. Glen Frazier has been very  dizzy, as well as not able to walk. The nurse reports that she has not  noticed any improvement. Given the above symptoms, the family was  wondering if they should take the patient to the emergency department. I  did advise that they should go ahead and take him to the emergency  department. They will be taking him to [facility].     Leanne Chang, M.D.  Electronically Signed    LA/MedQ  DD: 03/21/2007  DT: 03/22/2007  Job #: [id number]

## 2011-01-20 ENCOUNTER — Other Ambulatory Visit: Payer: Self-pay | Admitting: *Deleted

## 2011-01-20 NOTE — Telephone Encounter (Signed)
I thought this was tid prn I would change it to this and okay #90 x 1 Please see if you can talk to him and let him know he should just be taking this when he has his dizzy spells

## 2011-01-20 NOTE — Telephone Encounter (Signed)
Faxed request from [employer] is on your desk. 

## 2011-01-21 MED ORDER — MECLIZINE HCL 25 MG PO TABS: 12.5000 mg | ORAL_TABLET | Freq: Three times a day (TID) | ORAL | Status: DC | PRN

## 2011-01-21 NOTE — Telephone Encounter (Signed)
Left message on machine asking pt or his daughter to return my call.

## 2011-01-31 ENCOUNTER — Encounter: Payer: Self-pay | Admitting: Internal Medicine

## 2011-02-03 ENCOUNTER — Ambulatory Visit (INDEPENDENT_AMBULATORY_CARE_PROVIDER_SITE_OTHER): Payer: Medicare Other | Admitting: Internal Medicine

## 2011-02-03 ENCOUNTER — Encounter: Payer: Self-pay | Admitting: Internal Medicine

## 2011-02-03 VITALS — BP 120/60 | HR 62 | Temp 97.6°F | Ht 71.0 in | Wt 151.0 lb

## 2011-02-03 DIAGNOSIS — E785 Hyperlipidemia, unspecified: Secondary | ICD-10-CM

## 2011-02-03 DIAGNOSIS — F411 Generalized anxiety disorder: Secondary | ICD-10-CM

## 2011-02-03 DIAGNOSIS — F0280 Dementia in other diseases classified elsewhere without behavioral disturbance: Secondary | ICD-10-CM

## 2011-02-03 DIAGNOSIS — R42 Dizziness and giddiness: Secondary | ICD-10-CM

## 2011-02-03 DIAGNOSIS — I10 Essential (primary) hypertension: Secondary | ICD-10-CM

## 2011-02-03 LAB — CBC WITH DIFFERENTIAL/PLATELET
Basophils Relative: 0.7 % (ref 0.0–3.0)
Eosinophils Relative: 6.6 % — ABNORMAL HIGH (ref 0.0–5.0)
HCT: 38.2 % — ABNORMAL LOW (ref 39.0–52.0)
Lymphs Abs: 1.8 10*3/uL (ref 0.7–4.0)
Monocytes Relative: 6.7 % (ref 3.0–12.0)
Platelets: 319 10*3/uL (ref 150.0–400.0)
RBC: 4.05 Mil/uL — ABNORMAL LOW (ref 4.22–5.81)
WBC: 8.3 10*3/uL (ref 4.5–10.5)

## 2011-02-03 LAB — HEPATIC FUNCTION PANEL
ALT: 23 U/L (ref 0–53)
AST: 28 U/L (ref 0–37)
Albumin: 4.2 g/dL (ref 3.5–5.2)
Alkaline Phosphatase: 86 U/L (ref 39–117)
Total Bilirubin: 0.5 mg/dL (ref 0.3–1.2)

## 2011-02-03 LAB — BASIC METABOLIC PANEL
BUN: 33 mg/dL — ABNORMAL HIGH (ref 6–23)
Chloride: 103 mEq/L (ref 96–112)
GFR: 79.95 mL/min (ref >60.00)
Potassium: 4 mEq/L (ref 3.5–5.1)
Sodium: 142 mEq/L (ref 135–145)

## 2011-02-03 LAB — LIPID PANEL
LDL Cholesterol: 114 mg/dL — ABNORMAL HIGH (ref 0–99)
Total CHOL/HDL Ratio: 4
VLDL: 28 mg/dL (ref 0.0–40.0)

## 2011-02-03 LAB — TSH: TSH: 2.39 u[IU]/mL (ref 0.35–5.50)

## 2011-02-03 NOTE — Progress Notes (Signed)
Subjective:    Patient ID: Glen Frazier, male    DOB: [birth year], 77 y.o.   MRN: [id number]  HPI DOing okay  Here with daughter as usual  Memory problems slowly progressive Still independent with ADLs--but doesn't seem to bathe often  Daughter does the laundry and buys some groceries Meals are from them Someone helps with the meds but his drops haven't been refilled for 6 months Did have recent eval by Dr Fransico Michael  Ongoing vertigo and poor eyesight Fortunately, no falls Some slurring of speech Hasn't been taking the meclizine regularly--discussed that this would be okay  No chest pain No SOB No sig edema  Allergies  Allergen Reactions  . Paroxetine     REACTION: syncope, shakes  . Penicillins     REACTION: unspecified   Current Outpatient Prescriptions on File Prior to Visit  Medication Sig Dispense Refill  . aspirin 81 MG tablet Take 1 tablet (81 mg total) by mouth daily.  30 tablet  11  . donepezil (ARICEPT) 5 MG tablet Take 5 mg by mouth at bedtime as needed.        . hydrochlorothiazide (,MICROZIDE/HYDRODIURIL,) 12.5 MG capsule Take 12.5 mg by mouth daily.        Marland Kitchen latanoprost (XALATAN) 0.005 % ophthalmic solution Place 1 drop into both eyes daily.        Marland Kitchen lisinopril (PRINIVIL,ZESTRIL) 10 MG tablet Take 10 mg by mouth daily.        Marland Kitchen lovastatin (MEVACOR) 40 MG tablet Take 40 mg by mouth at bedtime.        . meclizine (ANTIVERT) 25 MG tablet Take 0.5 tablets (12.5 mg total) by mouth 3 (three) times daily as needed.  90 tablet  1  . sertraline (ZOLOFT) 100 MG tablet Take 100 mg by mouth daily.        . timolol (BETIMOL) 0.25 % ophthalmic solution Place 1 drop into both eyes daily.         Past Medical History  Diagnosis Date  . Allergy   . Hyperlipidemia   . Hypertension   . Vertigo   . Glaucoma   . Chronic back pain   . Anxiety   . Alzheimer's dementia   . Pilonidal cyst   . Brainstem infarct, acute     Past Surgical History  Procedure Date  .  Cervical disc surgery     Family History  Problem Relation Age of Onset  . Coronary artery disease Neg Hx     History   Social History  . Marital Status: Married    Spouse Name: N/A    Number of Children: 2  . Years of Education: N/A   Occupational History  . retired Quarry manager for [employer]    Social History Main Topics  . Smoking status: Former Games developer  . Smokeless tobacco: Not on file  . Alcohol Use: No  . Drug Use: No  . Sexually Active: Not on file   Other Topics Concern  . Not on file   Social History Narrative   Lives at [facility]   Review of Systems Sleeps well Appetite is fine Weight is stable    Objective:   Physical Exam  Constitutional: He appears well-developed and well-nourished. No distress.  Neck: Normal range of motion. Neck supple. No thyromegaly present.  Cardiovascular: Normal rate, regular rhythm, normal heart sounds and intact distal pulses.  Exam reveals no gallop.   No murmur heard. Pulmonary/Chest: Effort normal and breath  sounds normal. No respiratory distress. He has no wheezes. He has no rales.  Musculoskeletal: Normal range of motion. He exhibits no edema and no tenderness.  Lymphadenopathy:    He has no cervical adenopathy.  Psychiatric: He has a normal mood and affect. His behavior is normal. Judgment and thought content normal.          Assessment & Plan:

## 2011-02-03 NOTE — Assessment & Plan Note (Signed)
Slow deterioration Will probably need to move to a secure dementia unit within the next few months or at most 1 year On donepezil

## 2011-02-03 NOTE — Assessment & Plan Note (Signed)
Will try the meclizine daily

## 2011-02-03 NOTE — Assessment & Plan Note (Signed)
Lab Results  Component Value Date   LDLCALC 127* 01/21/2010   No problems with the med Due for labs

## 2011-02-03 NOTE — Assessment & Plan Note (Signed)
Controlled with the sertraline 

## 2011-02-03 NOTE — Assessment & Plan Note (Signed)
BP Readings from Last 3 Encounters:  02/03/11 120/60  10/01/10 120/84  07/01/10 140/80   Good control No changes needed Lab Results  Component Value Date   CREATININE 0.9 07/01/2010   Due for labs

## 2011-03-31 ENCOUNTER — Other Ambulatory Visit: Payer: Self-pay | Admitting: *Deleted

## 2011-03-31 MED ORDER — LISINOPRIL 10 MG PO TABS: 10.0000 mg | ORAL_TABLET | Freq: Every day | ORAL | Status: DC

## 2011-04-18 ENCOUNTER — Ambulatory Visit: Payer: Medicare Other | Admitting: Internal Medicine

## 2011-04-25 ENCOUNTER — Encounter: Payer: Self-pay | Admitting: Internal Medicine

## 2011-04-25 ENCOUNTER — Ambulatory Visit (INDEPENDENT_AMBULATORY_CARE_PROVIDER_SITE_OTHER): Payer: Medicare Other | Admitting: Internal Medicine

## 2011-04-25 DIAGNOSIS — G309 Alzheimer's disease, unspecified: Secondary | ICD-10-CM

## 2011-04-25 DIAGNOSIS — F0280 Dementia in other diseases classified elsewhere without behavioral disturbance: Secondary | ICD-10-CM

## 2011-04-25 NOTE — Assessment & Plan Note (Signed)
Has been progressing regularly Now has stool incontinence at times Needs daily med supervision Meals provided Dresses himself  Needs SNF for medicaid Not there yet No elopement risks  Will continue to follow  counselled all of 25 minute visit

## 2011-04-25 NOTE — Progress Notes (Signed)
Subjective:    Patient ID: Glen Frazier, male    DOB: 08-28-1927, 77 y.o.   MRN: [id number]  HPI Here with daughter and son Still at [facility] and he likes it there Children applying for medicaid---he makes too much for assisted living Would qualify for skilled though  No longer cleaning himself after BMs. Soiled chronically Has aide from [facility] to bathe him weekly Has supervised meds every morning by Home Instead. meds are kept locked up Sits in bed all day Is going down for his meals No apparent urinary incontinence  Car is gone Has not wandered or eloped at all  Has had some falls May have felt dizzy No chest pain or SOB  Current Outpatient Prescriptions on File Prior to Visit  Medication Sig Dispense Refill  . aspirin 81 MG tablet Take 1 tablet (81 mg total) by mouth daily.  30 tablet  11  . donepezil (ARICEPT) 5 MG tablet Take 5 mg by mouth at bedtime as needed.        . hydrochlorothiazide (,MICROZIDE/HYDRODIURIL,) 12.5 MG capsule Take 12.5 mg by mouth daily.        Marland Kitchen latanoprost (XALATAN) 0.005 % ophthalmic solution Place 1 drop into both eyes daily.        Marland Kitchen lisinopril (PRINIVIL,ZESTRIL) 10 MG tablet Take 1 tablet (10 mg total) by mouth daily.  30 tablet  11  . lovastatin (MEVACOR) 40 MG tablet Take 40 mg by mouth at bedtime.        . meclizine (ANTIVERT) 25 MG tablet Take 0.5 tablets (12.5 mg total) by mouth 3 (three) times daily as needed.  90 tablet  1  . sertraline (ZOLOFT) 100 MG tablet Take 100 mg by mouth daily.          Allergies  Allergen Reactions  . Paroxetine     REACTION: syncope, shakes  . Penicillins     REACTION: unspecified    Past Medical History  Diagnosis Date  . Allergy   . Hyperlipidemia   . Hypertension   . Vertigo   . Glaucoma   . Chronic back pain   . Anxiety   . Alzheimer's dementia   . Pilonidal cyst   . Brainstem infarct, acute     Past Surgical History  Procedure Date  . Cervical disc surgery     Family  History  Problem Relation Age of Onset  . Coronary artery disease Neg Hx     History   Social History  . Marital Status: Married    Spouse Name: N/A    Number of Children: 2  . Years of Education: N/A   Occupational History  . retired Quarry manager for [employer]    Social History Main Topics  . Smoking status: Former Games developer  . Smokeless tobacco: Never Used  . Alcohol Use: No  . Drug Use: No  . Sexually Active: Not on file   Other Topics Concern  . Not on file   Social History Narrative   Lives at [facility]   Review of Systems Sleeps okay Appetite is okay Weight is holding okay     Objective:   Physical Exam  Constitutional: He appears well-developed and well-nourished.       HOH  Neurological:       "Saturday, September, 2007" Didn't know place Thought son was his other son  Psychiatric:       Seems neutral mood Affect appropriate  Assessment & Plan:

## 2011-05-27 ENCOUNTER — Other Ambulatory Visit: Payer: Self-pay | Admitting: *Deleted

## 2011-05-27 ENCOUNTER — Telehealth: Payer: Self-pay | Admitting: *Deleted

## 2011-05-27 MED ORDER — HYDROCHLOROTHIAZIDE 12.5 MG PO CAPS: 12.5000 mg | ORAL_CAPSULE | Freq: Every day | ORAL | Status: DC

## 2011-05-27 MED ORDER — DONEPEZIL HCL 5 MG PO TABS: 5.0000 mg | ORAL_TABLET | Freq: Every evening | ORAL | Status: DC | PRN

## 2011-05-27 NOTE — Telephone Encounter (Signed)
Pharmacist called to question the strength of the [place] script that was called in.  It was for 5 mg's, pt has been on 10 mgs's.  Please advise.

## 2011-05-28 NOTE — Telephone Encounter (Signed)
We are trying to cut the dose---esp at his age and with dementia

## 2011-05-28 NOTE — Telephone Encounter (Signed)
The medication was Aricept not Ambien, per pharmacy ok they will fill.

## 2011-06-09 ENCOUNTER — Ambulatory Visit: Payer: Medicare Other | Admitting: Internal Medicine

## 2011-06-24 ENCOUNTER — Other Ambulatory Visit: Payer: Self-pay | Admitting: *Deleted

## 2011-06-24 MED ORDER — LOVASTATIN 40 MG PO TABS: 40.0000 mg | ORAL_TABLET | Freq: Every day | ORAL | Status: DC

## 2011-07-22 ENCOUNTER — Other Ambulatory Visit: Payer: Self-pay | Admitting: *Deleted

## 2011-07-22 MED ORDER — SERTRALINE HCL 100 MG PO TABS: 100.0000 mg | ORAL_TABLET | Freq: Every day | ORAL | Status: DC

## 2011-07-28 ENCOUNTER — Ambulatory Visit: Payer: Medicare Other | Admitting: Internal Medicine

## 2011-08-19 ENCOUNTER — Other Ambulatory Visit: Payer: Self-pay | Admitting: *Deleted

## 2011-08-19 MED ORDER — MECLIZINE HCL 25 MG PO TABS: 12.5000 mg | ORAL_TABLET | Freq: Three times a day (TID) | ORAL | Status: DC | PRN

## 2011-09-22 ENCOUNTER — Ambulatory Visit (INDEPENDENT_AMBULATORY_CARE_PROVIDER_SITE_OTHER): Payer: Medicare Other | Admitting: Internal Medicine

## 2011-09-22 ENCOUNTER — Encounter: Payer: Self-pay | Admitting: Internal Medicine

## 2011-09-22 DIAGNOSIS — F411 Generalized anxiety disorder: Secondary | ICD-10-CM

## 2011-09-22 DIAGNOSIS — I10 Essential (primary) hypertension: Secondary | ICD-10-CM

## 2011-09-22 DIAGNOSIS — I6350 Cerebral infarction due to unspecified occlusion or stenosis of unspecified cerebral artery: Secondary | ICD-10-CM

## 2011-09-22 DIAGNOSIS — F0280 Dementia in other diseases classified elsewhere without behavioral disturbance: Secondary | ICD-10-CM

## 2011-09-22 DIAGNOSIS — G309 Alzheimer's disease, unspecified: Secondary | ICD-10-CM

## 2011-09-22 NOTE — Assessment & Plan Note (Signed)
BP Readings from Last 3 Encounters:  09/22/11 168/80  04/25/11 146/71  02/03/11 120/60   Has been okay No changes for now

## 2011-09-22 NOTE — Assessment & Plan Note (Signed)
Only with vestibular symptoms at times Remains on ASA

## 2011-09-22 NOTE — Assessment & Plan Note (Signed)
Controlled on sertraline No changes indicated

## 2011-09-22 NOTE — Assessment & Plan Note (Signed)
Slow progression Given the incontinence, care needs and supervision required--could do FL-2 for SNF They will continue to explore options

## 2011-09-22 NOTE — Progress Notes (Signed)
Subjective:    Patient ID: Glen Frazier, male    DOB: 11-16-1927, 78 y.o.   MRN: [id number]  HPI Here with daughter Still at [facility] from [facility] every morning Needs supervised meds Goes down to meals there---but needs reminding  Still soils his underwear Might have some slight urinary incontinence Aide helps with bath He still dresses himself  Still needs SNF to qualify for medicaid due to his pension [employer]   Mood has been okay  No cehst pain No SOB No edema No palpitations  Current Outpatient Prescriptions on File Prior to Visit  Medication Sig Dispense Refill  . aspirin 81 MG tablet Take 1 tablet (81 mg total) by mouth daily.  30 tablet  11  . donepezil (ARICEPT) 5 MG tablet Take 1 tablet (5 mg total) by mouth at bedtime as needed.  30 tablet  11  . hydrochlorothiazide (MICROZIDE) 12.5 MG capsule Take 1 capsule (12.5 mg total) by mouth daily.  30 capsule  3  . latanoprost (XALATAN) 0.005 % ophthalmic solution Place 1 drop into both eyes daily.        Marland Kitchen lisinopril (PRINIVIL,ZESTRIL) 10 MG tablet Take 1 tablet (10 mg total) by mouth daily.  30 tablet  11  . lovastatin (MEVACOR) 40 MG tablet Take 1 tablet (40 mg total) by mouth at bedtime.  30 tablet  11  . meclizine (ANTIVERT) 25 MG tablet Take 0.5 tablets (12.5 mg total) by mouth 3 (three) times daily as needed.  90 tablet  1  . sertraline (ZOLOFT) 100 MG tablet Take 1 tablet (100 mg total) by mouth daily.  30 tablet  11  . timolol (TIMOPTIC) 0.5 % ophthalmic solution Place 1 drop into both eyes daily.         Allergies  Allergen Reactions  . Paroxetine     REACTION: syncope, shakes  . Penicillins     REACTION: unspecified    Past Medical History  Diagnosis Date  . Allergy   . Hyperlipidemia   . Hypertension   . Vertigo   . Glaucoma   . Chronic back pain   . Anxiety   . Alzheimer's dementia   . Pilonidal cyst   . Brainstem infarct, acute     Past Surgical History  Procedure Date  .  Cervical disc surgery     Family History  Problem Relation Age of Onset  . Coronary artery disease Neg Hx     History   Social History  . Marital Status: Married    Spouse Name: N/A    Number of Children: 2  . Years of Education: N/A   Occupational History  . retired Quarry manager for [employer]    Social History Main Topics  . Smoking status: Former Games developer  . Smokeless tobacco: Never Used  . Alcohol Use: No  . Drug Use: No  . Sexually Active: Not on file   Other Topics Concern  . Not on file   Social History Narrative   Lives at [facility]   Review of Systems Still sleeps fine Appetite is good Weight has been okay    Objective:   Physical Exam  Constitutional: He appears well-developed and well-nourished. No distress.  Neck: Normal range of motion. Neck supple. No thyromegaly present.  Cardiovascular: Normal rate, regular rhythm and normal heart sounds.  Exam reveals no gallop.   No murmur heard.      occ skips  Pulmonary/Chest: Effort normal and breath sounds  normal. No respiratory distress. He has no wheezes. He has no rales.  Musculoskeletal: He exhibits no edema and no tenderness.  Lymphadenopathy:    He has no cervical adenopathy.  Psychiatric: He has a normal mood and affect. His behavior is normal. Judgment and thought content normal.          Assessment & Plan:

## 2011-09-30 ENCOUNTER — Other Ambulatory Visit: Payer: Self-pay | Admitting: *Deleted

## 2011-09-30 MED ORDER — HYDROCHLOROTHIAZIDE 12.5 MG PO CAPS: 12.5000 mg | ORAL_CAPSULE | Freq: Every day | ORAL | Status: DC

## 2011-10-13 DIAGNOSIS — F411 Generalized anxiety disorder: Secondary | ICD-10-CM

## 2011-10-13 DIAGNOSIS — I10 Essential (primary) hypertension: Secondary | ICD-10-CM

## 2011-10-13 DIAGNOSIS — G309 Alzheimer's disease, unspecified: Secondary | ICD-10-CM

## 2011-10-13 DIAGNOSIS — F0280 Dementia in other diseases classified elsewhere without behavioral disturbance: Secondary | ICD-10-CM

## 2011-10-13 DIAGNOSIS — I6990 Unspecified sequelae of unspecified cerebrovascular disease: Secondary | ICD-10-CM

## 2011-10-20 ENCOUNTER — Telehealth: Payer: Self-pay | Admitting: Internal Medicine

## 2011-10-20 DIAGNOSIS — F329 Major depressive disorder, single episode, unspecified: Secondary | ICD-10-CM

## 2011-10-20 MED ORDER — ALPRAZOLAM 0.5 MG PO TABS: 0.5000 mg | ORAL_TABLET | Freq: Two times a day (BID) | ORAL | Status: AC | PRN

## 2011-10-20 NOTE — Telephone Encounter (Signed)
We could try some short term xanax with caution because it increases fall risk  Px written for call in   Update Dr Alphonsus Sias when he returns

## 2011-10-20 NOTE — Telephone Encounter (Signed)
Pt's daughter is calling about pt and his anxiety. He was recently put in [facility] and he is extremely anxious and as she stated " High Strung". She was wondering if anything could be prescribed to calm his nerves and help the transition to [facility] go over smoothly and better.

## 2011-10-20 NOTE — Telephone Encounter (Signed)
Spoke with daughter and advised results, daughter is calling to [facility] to see how pt can get the medication, he's in skilled nursing, I advised that i'm not familiar with how to do this, she will call back with instructions.

## 2011-10-20 NOTE — Telephone Encounter (Signed)
.  left message to have daughter return my call.

## 2011-10-20 NOTE — Telephone Encounter (Signed)
Spoke with daughter to advise that Dr.Letvak is not in the office this week and that I would ask and not promise if another physican could write for something, per daughter she understands.

## 2011-10-20 NOTE — Telephone Encounter (Signed)
Spoke with daughter again and she stated Dr. Dayton Martes had seen pt this morning and ok'd the rx.

## 2011-10-28 ENCOUNTER — Telehealth: Payer: Self-pay | Admitting: *Deleted

## 2011-10-28 NOTE — Telephone Encounter (Signed)
Patient dropped off paper work today and requests that Dr. Alphonsus Sias fill them out.  Form are from the Dept of [address], in your IN box.

## 2011-10-28 NOTE — Telephone Encounter (Signed)
Since he is at [facility], I will bring them there and fill them out there and send from there

## 2011-11-13 DIAGNOSIS — G309 Alzheimer's disease, unspecified: Secondary | ICD-10-CM

## 2011-11-13 DIAGNOSIS — I10 Essential (primary) hypertension: Secondary | ICD-10-CM

## 2011-11-13 DIAGNOSIS — F0280 Dementia in other diseases classified elsewhere without behavioral disturbance: Secondary | ICD-10-CM

## 2011-11-13 DIAGNOSIS — I6990 Unspecified sequelae of unspecified cerebrovascular disease: Secondary | ICD-10-CM

## 2011-11-13 DIAGNOSIS — F411 Generalized anxiety disorder: Secondary | ICD-10-CM

## 2011-12-11 DIAGNOSIS — I6990 Unspecified sequelae of unspecified cerebrovascular disease: Secondary | ICD-10-CM

## 2011-12-11 DIAGNOSIS — F0280 Dementia in other diseases classified elsewhere without behavioral disturbance: Secondary | ICD-10-CM

## 2011-12-11 DIAGNOSIS — G309 Alzheimer's disease, unspecified: Secondary | ICD-10-CM

## 2011-12-11 DIAGNOSIS — F411 Generalized anxiety disorder: Secondary | ICD-10-CM

## 2011-12-11 DIAGNOSIS — I10 Essential (primary) hypertension: Secondary | ICD-10-CM

## 2012-01-22 DIAGNOSIS — I6990 Unspecified sequelae of unspecified cerebrovascular disease: Secondary | ICD-10-CM

## 2012-01-22 DIAGNOSIS — F0280 Dementia in other diseases classified elsewhere without behavioral disturbance: Secondary | ICD-10-CM

## 2012-01-22 DIAGNOSIS — G309 Alzheimer's disease, unspecified: Secondary | ICD-10-CM

## 2012-01-22 DIAGNOSIS — F411 Generalized anxiety disorder: Secondary | ICD-10-CM

## 2012-01-22 DIAGNOSIS — I10 Essential (primary) hypertension: Secondary | ICD-10-CM

## 2012-01-23 ENCOUNTER — Ambulatory Visit: Payer: Medicare Other | Admitting: Internal Medicine

## 2012-03-30 DIAGNOSIS — F0280 Dementia in other diseases classified elsewhere without behavioral disturbance: Secondary | ICD-10-CM

## 2012-03-30 DIAGNOSIS — I6990 Unspecified sequelae of unspecified cerebrovascular disease: Secondary | ICD-10-CM

## 2012-03-30 DIAGNOSIS — F411 Generalized anxiety disorder: Secondary | ICD-10-CM

## 2012-03-30 DIAGNOSIS — I10 Essential (primary) hypertension: Secondary | ICD-10-CM

## 2012-03-30 DIAGNOSIS — G309 Alzheimer's disease, unspecified: Secondary | ICD-10-CM

## 2012-05-11 DIAGNOSIS — I10 Essential (primary) hypertension: Secondary | ICD-10-CM

## 2012-05-11 DIAGNOSIS — G309 Alzheimer's disease, unspecified: Secondary | ICD-10-CM

## 2012-05-11 DIAGNOSIS — F411 Generalized anxiety disorder: Secondary | ICD-10-CM

## 2012-05-11 DIAGNOSIS — I6990 Unspecified sequelae of unspecified cerebrovascular disease: Secondary | ICD-10-CM

## 2012-05-11 DIAGNOSIS — F0280 Dementia in other diseases classified elsewhere without behavioral disturbance: Secondary | ICD-10-CM

## 2012-07-20 DIAGNOSIS — E785 Hyperlipidemia, unspecified: Secondary | ICD-10-CM

## 2012-07-20 DIAGNOSIS — F0280 Dementia in other diseases classified elsewhere without behavioral disturbance: Secondary | ICD-10-CM

## 2012-07-20 DIAGNOSIS — I6990 Unspecified sequelae of unspecified cerebrovascular disease: Secondary | ICD-10-CM

## 2012-07-20 DIAGNOSIS — F411 Generalized anxiety disorder: Secondary | ICD-10-CM

## 2012-07-20 DIAGNOSIS — G309 Alzheimer's disease, unspecified: Secondary | ICD-10-CM

## 2012-09-16 DIAGNOSIS — F411 Generalized anxiety disorder: Secondary | ICD-10-CM

## 2012-09-16 DIAGNOSIS — I10 Essential (primary) hypertension: Secondary | ICD-10-CM

## 2012-09-16 DIAGNOSIS — G309 Alzheimer's disease, unspecified: Secondary | ICD-10-CM

## 2012-09-16 DIAGNOSIS — F0280 Dementia in other diseases classified elsewhere without behavioral disturbance: Secondary | ICD-10-CM

## 2012-09-16 DIAGNOSIS — I6990 Unspecified sequelae of unspecified cerebrovascular disease: Secondary | ICD-10-CM

## 2012-11-09 DIAGNOSIS — G309 Alzheimer's disease, unspecified: Secondary | ICD-10-CM

## 2012-11-09 DIAGNOSIS — I10 Essential (primary) hypertension: Secondary | ICD-10-CM

## 2012-11-09 DIAGNOSIS — F0280 Dementia in other diseases classified elsewhere without behavioral disturbance: Secondary | ICD-10-CM

## 2012-11-09 DIAGNOSIS — I6990 Unspecified sequelae of unspecified cerebrovascular disease: Secondary | ICD-10-CM

## 2012-11-09 DIAGNOSIS — F411 Generalized anxiety disorder: Secondary | ICD-10-CM

## 2013-01-17 DIAGNOSIS — F411 Generalized anxiety disorder: Secondary | ICD-10-CM

## 2013-01-17 DIAGNOSIS — I10 Essential (primary) hypertension: Secondary | ICD-10-CM

## 2013-01-17 DIAGNOSIS — I6990 Unspecified sequelae of unspecified cerebrovascular disease: Secondary | ICD-10-CM

## 2013-01-17 DIAGNOSIS — G309 Alzheimer's disease, unspecified: Secondary | ICD-10-CM

## 2013-01-17 DIAGNOSIS — F0280 Dementia in other diseases classified elsewhere without behavioral disturbance: Secondary | ICD-10-CM

## 2013-03-24 DIAGNOSIS — F411 Generalized anxiety disorder: Secondary | ICD-10-CM

## 2013-03-24 DIAGNOSIS — F0280 Dementia in other diseases classified elsewhere without behavioral disturbance: Secondary | ICD-10-CM

## 2013-03-24 DIAGNOSIS — I10 Essential (primary) hypertension: Secondary | ICD-10-CM

## 2013-03-24 DIAGNOSIS — I6990 Unspecified sequelae of unspecified cerebrovascular disease: Secondary | ICD-10-CM

## 2013-03-24 DIAGNOSIS — G309 Alzheimer's disease, unspecified: Secondary | ICD-10-CM

## 2013-05-17 DIAGNOSIS — I6990 Unspecified sequelae of unspecified cerebrovascular disease: Secondary | ICD-10-CM

## 2013-05-17 DIAGNOSIS — F411 Generalized anxiety disorder: Secondary | ICD-10-CM

## 2013-05-17 DIAGNOSIS — F0280 Dementia in other diseases classified elsewhere without behavioral disturbance: Secondary | ICD-10-CM

## 2013-05-17 DIAGNOSIS — I10 Essential (primary) hypertension: Secondary | ICD-10-CM

## 2013-05-17 DIAGNOSIS — G309 Alzheimer's disease, unspecified: Secondary | ICD-10-CM

## 2013-07-19 DIAGNOSIS — F0280 Dementia in other diseases classified elsewhere without behavioral disturbance: Secondary | ICD-10-CM

## 2013-07-19 DIAGNOSIS — F411 Generalized anxiety disorder: Secondary | ICD-10-CM

## 2013-07-19 DIAGNOSIS — I10 Essential (primary) hypertension: Secondary | ICD-10-CM

## 2013-07-19 DIAGNOSIS — I69998 Other sequelae following unspecified cerebrovascular disease: Secondary | ICD-10-CM

## 2013-07-19 DIAGNOSIS — G309 Alzheimer's disease, unspecified: Secondary | ICD-10-CM

## 2013-07-19 DIAGNOSIS — E782 Mixed hyperlipidemia: Secondary | ICD-10-CM

## 2013-09-26 DIAGNOSIS — F411 Generalized anxiety disorder: Secondary | ICD-10-CM

## 2013-09-26 DIAGNOSIS — I6990 Unspecified sequelae of unspecified cerebrovascular disease: Secondary | ICD-10-CM

## 2013-09-26 DIAGNOSIS — G309 Alzheimer's disease, unspecified: Secondary | ICD-10-CM

## 2013-09-26 DIAGNOSIS — I10 Essential (primary) hypertension: Secondary | ICD-10-CM

## 2013-09-26 DIAGNOSIS — F0280 Dementia in other diseases classified elsewhere without behavioral disturbance: Secondary | ICD-10-CM

## 2013-11-09 DIAGNOSIS — E782 Mixed hyperlipidemia: Secondary | ICD-10-CM

## 2013-11-09 DIAGNOSIS — I69998 Other sequelae following unspecified cerebrovascular disease: Secondary | ICD-10-CM

## 2013-11-09 DIAGNOSIS — I10 Essential (primary) hypertension: Secondary | ICD-10-CM

## 2013-11-09 DIAGNOSIS — F411 Generalized anxiety disorder: Secondary | ICD-10-CM

## 2013-11-09 DIAGNOSIS — G309 Alzheimer's disease, unspecified: Secondary | ICD-10-CM

## 2013-11-09 DIAGNOSIS — F0280 Dementia in other diseases classified elsewhere without behavioral disturbance: Secondary | ICD-10-CM

## 2013-11-17 DIAGNOSIS — L723 Sebaceous cyst: Secondary | ICD-10-CM

## 2014-01-12 DIAGNOSIS — I6990 Unspecified sequelae of unspecified cerebrovascular disease: Secondary | ICD-10-CM

## 2014-01-12 DIAGNOSIS — F411 Generalized anxiety disorder: Secondary | ICD-10-CM

## 2014-01-12 DIAGNOSIS — G309 Alzheimer's disease, unspecified: Secondary | ICD-10-CM

## 2014-01-12 DIAGNOSIS — F0280 Dementia in other diseases classified elsewhere without behavioral disturbance: Secondary | ICD-10-CM

## 2014-01-12 DIAGNOSIS — I10 Essential (primary) hypertension: Secondary | ICD-10-CM

## 2014-01-12 DIAGNOSIS — E785 Hyperlipidemia, unspecified: Secondary | ICD-10-CM

## 2014-03-14 DIAGNOSIS — I69998 Other sequelae following unspecified cerebrovascular disease: Secondary | ICD-10-CM

## 2014-03-14 DIAGNOSIS — F0280 Dementia in other diseases classified elsewhere without behavioral disturbance: Secondary | ICD-10-CM

## 2014-03-14 DIAGNOSIS — I10 Essential (primary) hypertension: Secondary | ICD-10-CM

## 2014-03-14 DIAGNOSIS — F411 Generalized anxiety disorder: Secondary | ICD-10-CM

## 2014-03-14 DIAGNOSIS — G309 Alzheimer's disease, unspecified: Secondary | ICD-10-CM

## 2014-03-14 DIAGNOSIS — E782 Mixed hyperlipidemia: Secondary | ICD-10-CM

## 2014-03-14 DIAGNOSIS — H409 Unspecified glaucoma: Secondary | ICD-10-CM

## 2014-05-11 DIAGNOSIS — I10 Essential (primary) hypertension: Secondary | ICD-10-CM

## 2014-05-11 DIAGNOSIS — E785 Hyperlipidemia, unspecified: Secondary | ICD-10-CM

## 2014-05-11 DIAGNOSIS — I638 Other cerebral infarction: Secondary | ICD-10-CM

## 2014-05-11 DIAGNOSIS — F419 Anxiety disorder, unspecified: Secondary | ICD-10-CM

## 2014-05-11 DIAGNOSIS — G301 Alzheimer's disease with late onset: Secondary | ICD-10-CM

## 2014-06-13 DIAGNOSIS — G301 Alzheimer's disease with late onset: Secondary | ICD-10-CM

## 2014-06-13 DIAGNOSIS — I639 Cerebral infarction, unspecified: Secondary | ICD-10-CM

## 2014-06-19 DIAGNOSIS — E039 Hypothyroidism, unspecified: Secondary | ICD-10-CM

## 2014-07-12 DIAGNOSIS — J4530 Mild persistent asthma, uncomplicated: Secondary | ICD-10-CM

## 2014-07-12 DIAGNOSIS — I10 Essential (primary) hypertension: Secondary | ICD-10-CM

## 2014-07-12 DIAGNOSIS — G309 Alzheimer's disease, unspecified: Secondary | ICD-10-CM

## 2014-07-12 DIAGNOSIS — E039 Hypothyroidism, unspecified: Secondary | ICD-10-CM

## 2014-07-12 DIAGNOSIS — I69398 Other sequelae of cerebral infarction: Secondary | ICD-10-CM

## 2014-07-12 DIAGNOSIS — F419 Anxiety disorder, unspecified: Secondary | ICD-10-CM

## 2014-09-14 DIAGNOSIS — I10 Essential (primary) hypertension: Secondary | ICD-10-CM

## 2014-09-14 DIAGNOSIS — E785 Hyperlipidemia, unspecified: Secondary | ICD-10-CM

## 2014-09-14 DIAGNOSIS — F419 Anxiety disorder, unspecified: Secondary | ICD-10-CM

## 2014-09-14 DIAGNOSIS — G301 Alzheimer's disease with late onset: Secondary | ICD-10-CM

## 2014-09-14 DIAGNOSIS — I638 Other cerebral infarction: Secondary | ICD-10-CM

## 2014-09-28 ENCOUNTER — Ambulatory Visit (INDEPENDENT_AMBULATORY_CARE_PROVIDER_SITE_OTHER): Payer: Medicare Other | Admitting: Podiatry

## 2014-09-28 ENCOUNTER — Encounter: Payer: Self-pay | Admitting: Podiatry

## 2014-09-28 VITALS — BP 201/105 | HR 54 | Resp 18

## 2014-09-28 DIAGNOSIS — B351 Tinea unguium: Secondary | ICD-10-CM

## 2014-09-28 DIAGNOSIS — M79676 Pain in unspecified toe(s): Secondary | ICD-10-CM

## 2014-09-28 NOTE — Progress Notes (Signed)
   Subjective:    Patient ID: Glen Frazier, male    DOB: 23-Sep-1927, 81 y.o.   MRN: [id number]  HPI  81 year old male presents the office today with a caregiver from his facility with complaints of painful, elongated toenails. Patient's caregiver states that they are unable to trim the nails himself due to the thickness. Denies any recent redness or drainage from the nail sites. No other complaints at this time.   Review of Systems  All other systems reviewed and are negative.      Objective:   Physical Exam Awake, NAD DP/PT pulses palpable, CRT less than 3 seconds Difficult to evaluate for tetanus sensation however Achilles tendon reflex intact. Nails are hypertrophic, elongated, brittle, discolored 10. There is no surrounding erythema or drainage. There does appear to be tenderness upon palpation of the nails. No open lesions or pre-ulcer lesions identified. No interdigital maceration. No other areas of apparent tenderness to bilateral lower extremities.  No pain with calf compression, swelling, warmth, erythema.       Assessment & Plan:  81 year old male with symptomatic onychomycosis, Alzheimers  -Nail sharply debrided 10 without complication/bleeding. -Follow-up in 3 months or sooner if any problems are to arise. In the meantime occur to call the office with any questions, concerns, change in symptoms.

## 2014-09-29 ENCOUNTER — Encounter: Payer: Self-pay | Admitting: Podiatry

## 2014-11-14 DIAGNOSIS — G309 Alzheimer's disease, unspecified: Secondary | ICD-10-CM

## 2014-11-14 DIAGNOSIS — F419 Anxiety disorder, unspecified: Secondary | ICD-10-CM

## 2014-11-14 DIAGNOSIS — E039 Hypothyroidism, unspecified: Secondary | ICD-10-CM | POA: Diagnosis not present

## 2014-11-14 DIAGNOSIS — I10 Essential (primary) hypertension: Secondary | ICD-10-CM | POA: Diagnosis not present

## 2014-11-14 DIAGNOSIS — E785 Hyperlipidemia, unspecified: Secondary | ICD-10-CM

## 2014-11-14 DIAGNOSIS — I69398 Other sequelae of cerebral infarction: Secondary | ICD-10-CM

## 2015-01-04 ENCOUNTER — Ambulatory Visit (INDEPENDENT_AMBULATORY_CARE_PROVIDER_SITE_OTHER): Payer: Medicare Other | Admitting: Podiatry

## 2015-01-04 DIAGNOSIS — B351 Tinea unguium: Secondary | ICD-10-CM

## 2015-01-04 DIAGNOSIS — M79676 Pain in unspecified toe(s): Secondary | ICD-10-CM

## 2015-01-04 NOTE — Progress Notes (Signed)
Subjective: 81 y.o.-year-old male returns the office today for painful, elongated, thickened toenails which he is unable to trim himself. Denies any redness or drainage around the nails. Denies any acute changes since last appointment and no new complaints today. Denies any systemic complaints such as fevers, chills, nausea, vomiting.   Objective: AAO 3, NAD DP/PT pulses palpable, CRT less than 3 seconds Nails hypertrophic, dystrophic, elongated, brittle, discolored 10. There is tenderness overlying the nails 1-5 bilaterally. There is no surrounding erythema or drainage along the nail sites. No open lesions or pre-ulcerative lesions are identified. No other areas of tenderness bilateral lower extremities. No overlying edema, erythema, increased warmth. No pain with calf compression, swelling, warmth, erythema.  Assessment: Patient presents with symptomatic onychomycosis  Plan: -Treatment options including alternatives, risks, complications were discussed -Nails sharply debrided 10 without complication/bleeding. -Discussed daily foot inspection. If there are any changes, to call the office immediately.  -Follow-up in 3 months or sooner if any problems are to arise. In the meantime, encouraged to call the office with any questions, concerns, changes symptoms.

## 2015-01-12 DIAGNOSIS — F39 Unspecified mood [affective] disorder: Secondary | ICD-10-CM

## 2015-01-12 DIAGNOSIS — I69398 Other sequelae of cerebral infarction: Secondary | ICD-10-CM

## 2015-01-12 DIAGNOSIS — I10 Essential (primary) hypertension: Secondary | ICD-10-CM | POA: Diagnosis not present

## 2015-01-12 DIAGNOSIS — E785 Hyperlipidemia, unspecified: Secondary | ICD-10-CM | POA: Diagnosis not present

## 2015-01-12 DIAGNOSIS — G301 Alzheimer's disease with late onset: Secondary | ICD-10-CM | POA: Diagnosis not present

## 2015-03-14 DIAGNOSIS — G309 Alzheimer's disease, unspecified: Secondary | ICD-10-CM

## 2015-03-14 DIAGNOSIS — E785 Hyperlipidemia, unspecified: Secondary | ICD-10-CM

## 2015-03-14 DIAGNOSIS — F39 Unspecified mood [affective] disorder: Secondary | ICD-10-CM

## 2015-03-14 DIAGNOSIS — I10 Essential (primary) hypertension: Secondary | ICD-10-CM | POA: Diagnosis not present

## 2015-03-14 DIAGNOSIS — I69398 Other sequelae of cerebral infarction: Secondary | ICD-10-CM

## 2015-03-30 DIAGNOSIS — F22 Delusional disorders: Secondary | ICD-10-CM

## 2015-03-30 DIAGNOSIS — F419 Anxiety disorder, unspecified: Secondary | ICD-10-CM | POA: Diagnosis not present

## 2015-05-03 ENCOUNTER — Ambulatory Visit: Payer: Medicare Other

## 2015-05-11 ENCOUNTER — Ambulatory Visit: Payer: Medicare Other

## 2015-05-14 DIAGNOSIS — I69398 Other sequelae of cerebral infarction: Secondary | ICD-10-CM | POA: Diagnosis not present

## 2015-05-14 DIAGNOSIS — F22 Delusional disorders: Secondary | ICD-10-CM | POA: Diagnosis not present

## 2015-05-14 DIAGNOSIS — G301 Alzheimer's disease with late onset: Secondary | ICD-10-CM

## 2015-05-14 DIAGNOSIS — F39 Unspecified mood [affective] disorder: Secondary | ICD-10-CM

## 2015-07-18 DIAGNOSIS — F39 Unspecified mood [affective] disorder: Secondary | ICD-10-CM

## 2015-07-18 DIAGNOSIS — I69398 Other sequelae of cerebral infarction: Secondary | ICD-10-CM

## 2015-07-18 DIAGNOSIS — F22 Delusional disorders: Secondary | ICD-10-CM

## 2015-07-18 DIAGNOSIS — I10 Essential (primary) hypertension: Secondary | ICD-10-CM

## 2015-07-18 DIAGNOSIS — E785 Hyperlipidemia, unspecified: Secondary | ICD-10-CM

## 2015-07-18 DIAGNOSIS — G309 Alzheimer's disease, unspecified: Secondary | ICD-10-CM

## 2015-07-18 DIAGNOSIS — E039 Hypothyroidism, unspecified: Secondary | ICD-10-CM

## 2015-08-24 DIAGNOSIS — K4090 Unilateral inguinal hernia, without obstruction or gangrene, not specified as recurrent: Secondary | ICD-10-CM

## 2015-09-18 DIAGNOSIS — F22 Delusional disorders: Secondary | ICD-10-CM

## 2015-09-18 DIAGNOSIS — I69398 Other sequelae of cerebral infarction: Secondary | ICD-10-CM

## 2015-09-18 DIAGNOSIS — I10 Essential (primary) hypertension: Secondary | ICD-10-CM | POA: Diagnosis not present

## 2015-09-18 DIAGNOSIS — G301 Alzheimer's disease with late onset: Secondary | ICD-10-CM

## 2015-09-18 DIAGNOSIS — F39 Unspecified mood [affective] disorder: Secondary | ICD-10-CM

## 2015-09-21 ENCOUNTER — Encounter: Payer: Self-pay | Admitting: *Deleted

## 2015-11-14 DIAGNOSIS — I69398 Other sequelae of cerebral infarction: Secondary | ICD-10-CM | POA: Diagnosis not present

## 2015-11-14 DIAGNOSIS — E039 Hypothyroidism, unspecified: Secondary | ICD-10-CM | POA: Diagnosis not present

## 2015-11-14 DIAGNOSIS — F22 Delusional disorders: Secondary | ICD-10-CM

## 2015-11-14 DIAGNOSIS — G309 Alzheimer's disease, unspecified: Secondary | ICD-10-CM | POA: Diagnosis not present

## 2015-11-14 DIAGNOSIS — I10 Essential (primary) hypertension: Secondary | ICD-10-CM | POA: Diagnosis not present

## 2015-11-14 DIAGNOSIS — F3489 Other specified persistent mood disorders: Secondary | ICD-10-CM

## 2015-11-22 DIAGNOSIS — L723 Sebaceous cyst: Secondary | ICD-10-CM | POA: Diagnosis not present

## 2016-01-10 DIAGNOSIS — F22 Delusional disorders: Secondary | ICD-10-CM | POA: Diagnosis not present

## 2016-01-10 DIAGNOSIS — I10 Essential (primary) hypertension: Secondary | ICD-10-CM

## 2016-01-10 DIAGNOSIS — G308 Other Alzheimer's disease: Secondary | ICD-10-CM | POA: Diagnosis not present

## 2016-01-10 DIAGNOSIS — F39 Unspecified mood [affective] disorder: Secondary | ICD-10-CM | POA: Diagnosis not present

## 2016-01-26 ENCOUNTER — Other Ambulatory Visit: Payer: Self-pay

## 2016-01-26 ENCOUNTER — Encounter: Payer: Self-pay | Admitting: Emergency Medicine

## 2016-01-26 ENCOUNTER — Inpatient Hospital Stay
Admission: EM | Admit: 2016-01-26 | Discharge: 2016-01-28 | DRG: 871 | Disposition: A | Discharge status: Skilled Nursing Facility | Payer: Medicare Other | Attending: Internal Medicine | Admitting: Internal Medicine

## 2016-01-26 ENCOUNTER — Emergency Department: Payer: Medicare Other

## 2016-01-26 DIAGNOSIS — G309 Alzheimer's disease, unspecified: Secondary | ICD-10-CM | POA: Diagnosis present

## 2016-01-26 DIAGNOSIS — E785 Hyperlipidemia, unspecified: Secondary | ICD-10-CM | POA: Diagnosis present

## 2016-01-26 DIAGNOSIS — A419 Sepsis, unspecified organism: Principal | ICD-10-CM | POA: Diagnosis present

## 2016-01-26 DIAGNOSIS — Z515 Encounter for palliative care: Secondary | ICD-10-CM | POA: Diagnosis present

## 2016-01-26 DIAGNOSIS — Z681 Body mass index (BMI) 19 or less, adult: Secondary | ICD-10-CM | POA: Diagnosis not present

## 2016-01-26 DIAGNOSIS — I10 Essential (primary) hypertension: Secondary | ICD-10-CM | POA: Diagnosis present

## 2016-01-26 DIAGNOSIS — K922 Gastrointestinal hemorrhage, unspecified: Secondary | ICD-10-CM

## 2016-01-26 DIAGNOSIS — E860 Dehydration: Secondary | ICD-10-CM | POA: Diagnosis present

## 2016-01-26 DIAGNOSIS — Z79899 Other long term (current) drug therapy: Secondary | ICD-10-CM

## 2016-01-26 DIAGNOSIS — H409 Unspecified glaucoma: Secondary | ICD-10-CM | POA: Diagnosis present

## 2016-01-26 DIAGNOSIS — Z888 Allergy status to other drugs, medicaments and biological substances status: Secondary | ICD-10-CM | POA: Diagnosis not present

## 2016-01-26 DIAGNOSIS — L8990 Pressure ulcer of unspecified site, unspecified stage: Secondary | ICD-10-CM | POA: Diagnosis present

## 2016-01-26 DIAGNOSIS — F0280 Dementia in other diseases classified elsewhere without behavioral disturbance: Secondary | ICD-10-CM | POA: Diagnosis present

## 2016-01-26 DIAGNOSIS — N170 Acute kidney failure with tubular necrosis: Secondary | ICD-10-CM | POA: Diagnosis present

## 2016-01-26 DIAGNOSIS — N179 Acute kidney failure, unspecified: Secondary | ICD-10-CM | POA: Diagnosis not present

## 2016-01-26 DIAGNOSIS — G8929 Other chronic pain: Secondary | ICD-10-CM | POA: Diagnosis present

## 2016-01-26 DIAGNOSIS — M549 Dorsalgia, unspecified: Secondary | ICD-10-CM | POA: Diagnosis present

## 2016-01-26 DIAGNOSIS — R6520 Severe sepsis without septic shock: Secondary | ICD-10-CM | POA: Diagnosis present

## 2016-01-26 DIAGNOSIS — H9190 Unspecified hearing loss, unspecified ear: Secondary | ICD-10-CM | POA: Diagnosis present

## 2016-01-26 DIAGNOSIS — I248 Other forms of acute ischemic heart disease: Secondary | ICD-10-CM | POA: Diagnosis present

## 2016-01-26 DIAGNOSIS — E876 Hypokalemia: Secondary | ICD-10-CM | POA: Diagnosis present

## 2016-01-26 DIAGNOSIS — Z7982 Long term (current) use of aspirin: Secondary | ICD-10-CM | POA: Diagnosis not present

## 2016-01-26 DIAGNOSIS — E875 Hyperkalemia: Secondary | ICD-10-CM | POA: Diagnosis present

## 2016-01-26 DIAGNOSIS — R627 Adult failure to thrive: Secondary | ICD-10-CM | POA: Diagnosis present

## 2016-01-26 DIAGNOSIS — Z880 Allergy status to penicillin: Secondary | ICD-10-CM | POA: Diagnosis not present

## 2016-01-26 DIAGNOSIS — R778 Other specified abnormalities of plasma proteins: Secondary | ICD-10-CM

## 2016-01-26 DIAGNOSIS — R112 Nausea with vomiting, unspecified: Secondary | ICD-10-CM

## 2016-01-26 DIAGNOSIS — Z66 Do not resuscitate: Secondary | ICD-10-CM | POA: Diagnosis present

## 2016-01-26 DIAGNOSIS — R7989 Other specified abnormal findings of blood chemistry: Secondary | ICD-10-CM

## 2016-01-26 DIAGNOSIS — G9341 Metabolic encephalopathy: Secondary | ICD-10-CM | POA: Diagnosis present

## 2016-01-26 DIAGNOSIS — G308 Other Alzheimer's disease: Secondary | ICD-10-CM

## 2016-01-26 DIAGNOSIS — Z8042 Family history of malignant neoplasm of prostate: Secondary | ICD-10-CM | POA: Diagnosis not present

## 2016-01-26 DIAGNOSIS — Z87891 Personal history of nicotine dependence: Secondary | ICD-10-CM

## 2016-01-26 DIAGNOSIS — K529 Noninfective gastroenteritis and colitis, unspecified: Secondary | ICD-10-CM | POA: Diagnosis present

## 2016-01-26 DIAGNOSIS — E46 Unspecified protein-calorie malnutrition: Secondary | ICD-10-CM | POA: Diagnosis present

## 2016-01-26 DIAGNOSIS — S32592A Other specified fracture of left pubis, initial encounter for closed fracture: Secondary | ICD-10-CM

## 2016-01-26 DIAGNOSIS — E870 Hyperosmolality and hypernatremia: Secondary | ICD-10-CM | POA: Diagnosis present

## 2016-01-26 LAB — COMPREHENSIVE METABOLIC PANEL
ALBUMIN: 3.8 g/dL (ref 3.5–5.0)
ALT: 14 U/L — ABNORMAL LOW (ref 17–63)
ANION GAP: 20 — AB (ref 5–15)
AST: 45 U/L — ABNORMAL HIGH (ref 15–41)
Alkaline Phosphatase: 91 U/L (ref 38–126)
BILIRUBIN TOTAL: 1 mg/dL (ref 0.3–1.2)
BUN: 44 mg/dL — ABNORMAL HIGH (ref 6–20)
CALCIUM: 8.8 mg/dL — AB (ref 8.9–10.3)
CO2: 18 mmol/L — ABNORMAL LOW (ref 22–32)
Chloride: 109 mmol/L (ref 101–111)
Creatinine, Ser: 1.79 mg/dL — ABNORMAL HIGH (ref 0.61–1.24)
GFR calc non Af Amer: 33 mL/min — ABNORMAL LOW (ref >60)
GFR, EST AFRICAN AMERICAN: 38 mL/min — AB (ref >60)
GLUCOSE: 210 mg/dL — AB (ref 65–99)
POTASSIUM: 3.1 mmol/L — AB (ref 3.5–5.1)
SODIUM: 147 mmol/L — AB (ref 135–145)
TOTAL PROTEIN: 7.1 g/dL (ref 6.5–8.1)

## 2016-01-26 LAB — TROPONIN I: TROPONIN I: 0.05 ng/mL — AB (ref <0.031)

## 2016-01-26 LAB — CBC WITH DIFFERENTIAL/PLATELET
BASOS ABS: 0 10*3/uL (ref 0–0.1)
EOS ABS: 0 10*3/uL (ref 0–0.7)
HCT: 43.6 % (ref 40.0–52.0)
HEMOGLOBIN: 13.9 g/dL (ref 13.0–18.0)
LYMPHS ABS: 0.6 10*3/uL — AB (ref 1.0–3.6)
Lymphocytes Relative: 5 %
MCH: 29.5 pg (ref 26.0–34.0)
MCHC: 31.9 g/dL — ABNORMAL LOW (ref 32.0–36.0)
MCV: 92.6 fL (ref 80.0–100.0)
Monocytes Absolute: 0.6 10*3/uL (ref 0.2–1.0)
Monocytes Relative: 4 %
Neutro Abs: 11.8 10*3/uL — ABNORMAL HIGH (ref 1.4–6.5)
PLATELETS: 305 10*3/uL (ref 150–440)
RBC: 4.71 MIL/uL (ref 4.40–5.90)
RDW: 14.9 % — ABNORMAL HIGH (ref 11.5–14.5)
WBC: 13 10*3/uL — AB (ref 3.8–10.6)

## 2016-01-26 LAB — GASTROINTESTINAL PANEL BY PCR, STOOL (REPLACES STOOL CULTURE)
ADENOVIRUS F40/41: NOT DETECTED
ASTROVIRUS: NOT DETECTED
CAMPYLOBACTER SPECIES: NOT DETECTED
Cryptosporidium: NOT DETECTED
Cyclospora cayetanensis: NOT DETECTED
E. COLI O157: NOT DETECTED
ENTEROPATHOGENIC E COLI (EPEC): NOT DETECTED
ENTEROTOXIGENIC E COLI (ETEC): NOT DETECTED
Entamoeba histolytica: NOT DETECTED
Enteroaggregative E coli (EAEC): NOT DETECTED
Giardia lamblia: NOT DETECTED
NOROVIRUS GI/GII: NOT DETECTED
PLESIMONAS SHIGELLOIDES: NOT DETECTED
ROTAVIRUS A: NOT DETECTED
SAPOVIRUS (I, II, IV, AND V): NOT DETECTED
SHIGA LIKE TOXIN PRODUCING E COLI (STEC): NOT DETECTED
Salmonella species: NOT DETECTED
Shigella/Enteroinvasive E coli (EIEC): NOT DETECTED
VIBRIO SPECIES: NOT DETECTED
Vibrio cholerae: NOT DETECTED
Yersinia enterocolitica: NOT DETECTED

## 2016-01-26 LAB — LACTIC ACID, PLASMA
LACTIC ACID, VENOUS: 5.4 mmol/L — AB (ref 0.5–2.0)
Lactic Acid, Venous: 9.6 mmol/L (ref 0.5–2.0)

## 2016-01-26 LAB — URINALYSIS COMPLETE WITH MICROSCOPIC (ARMC ONLY)
BILIRUBIN URINE: NEGATIVE
GLUCOSE, UA: NEGATIVE mg/dL
HGB URINE DIPSTICK: NEGATIVE
KETONES UR: NEGATIVE mg/dL
LEUKOCYTES UA: NEGATIVE
NITRITE: NEGATIVE
PH: 5 (ref 5.0–8.0)
PROTEIN: 100 mg/dL — AB
SPECIFIC GRAVITY, URINE: 1.023 (ref 1.005–1.030)

## 2016-01-26 LAB — PROTIME-INR
INR: 1.4
PROTHROMBIN TIME: 17.3 s — AB (ref 11.4–15.0)

## 2016-01-26 LAB — C DIFFICILE QUICK SCREEN W PCR REFLEX
C DIFFICILE (CDIFF) INTERP: NEGATIVE
C DIFFICILE (CDIFF) TOXIN: NEGATIVE
C DIFFICLE (CDIFF) ANTIGEN: NEGATIVE

## 2016-01-26 LAB — LIPASE, BLOOD: Lipase: 69 U/L — ABNORMAL HIGH (ref 11–51)

## 2016-01-26 LAB — SURGICAL PCR SCREEN
MRSA, PCR: NEGATIVE
Staphylococcus aureus: NEGATIVE

## 2016-01-26 LAB — APTT: aPTT: 28 seconds (ref 24–36)

## 2016-01-26 MED ORDER — DONEPEZIL HCL 5 MG PO TABS
10.0000 mg | ORAL_TABLET | Freq: Every day | ORAL | Status: DC
  Administered 2016-01-27: 10 mg via ORAL
  Filled 2016-01-26: qty 2

## 2016-01-26 MED ORDER — ASPIRIN 81 MG PO CHEW
81.0000 mg | CHEWABLE_TABLET | Freq: Every day | ORAL | Status: DC
  Administered 2016-01-27: 81 mg via ORAL
  Filled 2016-01-26: qty 1

## 2016-01-26 MED ORDER — LEVOFLOXACIN IN D5W 750 MG/150ML IV SOLN
750.0000 mg | INTRAVENOUS | Status: DC
  Filled 2016-01-26: qty 150

## 2016-01-26 MED ORDER — ACETAMINOPHEN 325 MG PO TABS
650.0000 mg | ORAL_TABLET | Freq: Two times a day (BID) | ORAL | Status: DC
  Administered 2016-01-26 – 2016-01-27 (×2): 650 mg via ORAL
  Filled 2016-01-26 (×2): qty 2

## 2016-01-26 MED ORDER — MECLIZINE HCL 25 MG PO TABS: 12.5000 mg | ORAL_TABLET | Freq: Three times a day (TID) | ORAL | Status: DC | PRN

## 2016-01-26 MED ORDER — TIMOLOL MALEATE 0.5 % OP SOLN
1.0000 [drp] | Freq: Every day | OPHTHALMIC | Status: DC
  Administered 2016-01-27: 09:00:00 1 [drp] via OPHTHALMIC
  Filled 2016-01-26: qty 5

## 2016-01-26 MED ORDER — SODIUM CHLORIDE 0.9 % IV SOLN: Freq: Once | INTRAVENOUS | Status: DC

## 2016-01-26 MED ORDER — VANCOMYCIN HCL IN DEXTROSE 750-5 MG/150ML-% IV SOLN
750.0000 mg | INTRAVENOUS | Status: DC
  Administered 2016-01-27: 01:00:00 750 mg via INTRAVENOUS
  Filled 2016-01-26 (×3): qty 150

## 2016-01-26 MED ORDER — VANCOMYCIN HCL IN DEXTROSE 1-5 GM/200ML-% IV SOLN
1000.0000 mg | Freq: Once | INTRAVENOUS | Status: AC
  Administered 2016-01-26: 1000 mg via INTRAVENOUS
  Filled 2016-01-26: qty 200

## 2016-01-26 MED ORDER — DEXTROSE 5 % IV SOLN
2.0000 g | Freq: Once | INTRAVENOUS | Status: AC
  Administered 2016-01-26: 2 g via INTRAVENOUS
  Filled 2016-01-26: qty 2

## 2016-01-26 MED ORDER — ONDANSETRON HCL 4 MG/2ML IJ SOLN: 4.0000 mg | Freq: Four times a day (QID) | INTRAMUSCULAR | Status: DC | PRN

## 2016-01-26 MED ORDER — SODIUM CHLORIDE 0.9 % IV BOLUS (SEPSIS)
250.0000 mL | Freq: Once | INTRAVENOUS | Status: AC
  Administered 2016-01-26: 250 mL via INTRAVENOUS

## 2016-01-26 MED ORDER — POTASSIUM CHLORIDE IN NACL 20-0.9 MEQ/L-% IV SOLN
INTRAVENOUS | Status: DC
  Administered 2016-01-26 – 2016-01-27 (×3): via INTRAVENOUS
  Filled 2016-01-26 (×9): qty 1000

## 2016-01-26 MED ORDER — ONDANSETRON HCL 4 MG/2ML IJ SOLN
4.0000 mg | INTRAMUSCULAR | Status: AC
  Administered 2016-01-26: 4 mg via INTRAVENOUS
  Filled 2016-01-26: qty 2

## 2016-01-26 MED ORDER — ONDANSETRON HCL 4 MG PO TABS: 4.0000 mg | ORAL_TABLET | Freq: Four times a day (QID) | ORAL | Status: DC | PRN

## 2016-01-26 MED ORDER — SODIUM CHLORIDE 0.9 % IV BOLUS (SEPSIS)
1000.0000 mL | Freq: Once | INTRAVENOUS | Status: AC
Start: 2016-01-26 — End: 2016-01-26
  Administered 2016-01-26: 1000 mL via INTRAVENOUS

## 2016-01-26 MED ORDER — SERTRALINE HCL 50 MG PO TABS
150.0000 mg | ORAL_TABLET | Freq: Every day | ORAL | Status: DC
  Administered 2016-01-27: 150 mg via ORAL
  Filled 2016-01-26: qty 1

## 2016-01-26 MED ORDER — SODIUM CHLORIDE 0.9 % IV BOLUS (SEPSIS)
1000.0000 mL | Freq: Once | INTRAVENOUS | Status: AC
  Administered 2016-01-26: 1000 mL via INTRAVENOUS

## 2016-01-26 MED ORDER — LATANOPROST 0.005 % OP SOLN
1.0000 [drp] | Freq: Every evening | OPHTHALMIC | Status: DC
  Administered 2016-01-26: 21:00:00 1 [drp] via OPHTHALMIC
  Filled 2016-01-26: qty 2.5

## 2016-01-26 MED ORDER — ACETAMINOPHEN 325 MG PO TABS: 650.0000 mg | ORAL_TABLET | Freq: Four times a day (QID) | ORAL | Status: DC | PRN

## 2016-01-26 MED ORDER — AZTREONAM 1 G IJ SOLR
1.0000 g | Freq: Three times a day (TID) | INTRAMUSCULAR | Status: DC
  Administered 2016-01-27 (×2): 1 g via INTRAVENOUS
  Filled 2016-01-26 (×6): qty 1

## 2016-01-26 MED ORDER — LEVOFLOXACIN IN D5W 750 MG/150ML IV SOLN
750.0000 mg | Freq: Once | INTRAVENOUS | Status: AC
  Administered 2016-01-26: 750 mg via INTRAVENOUS
  Filled 2016-01-26: qty 150

## 2016-01-26 MED ORDER — ACETAMINOPHEN 650 MG RE SUPP: 650.0000 mg | Freq: Four times a day (QID) | RECTAL | Status: DC | PRN

## 2016-01-26 MED ORDER — MEMANTINE HCL 5 MG PO TABS
10.0000 mg | ORAL_TABLET | Freq: Two times a day (BID) | ORAL | Status: DC
  Administered 2016-01-26 – 2016-01-27 (×2): 10 mg via ORAL
  Filled 2016-01-26 (×2): qty 2

## 2016-01-26 MED ORDER — MORPHINE SULFATE (PF) 4 MG/ML IV SOLN
4.0000 mg | Freq: Once | INTRAVENOUS | Status: AC
  Administered 2016-01-26: 4 mg via INTRAVENOUS
  Filled 2016-01-26: qty 1

## 2016-01-26 MED ORDER — LEVOTHYROXINE SODIUM 25 MCG PO TABS
25.0000 ug | ORAL_TABLET | Freq: Every day | ORAL | Status: DC
  Administered 2016-01-27: 09:00:00 25 ug via ORAL
  Filled 2016-01-26: qty 1

## 2016-01-26 MED ORDER — HEPARIN SODIUM (PORCINE) 5000 UNIT/ML IJ SOLN
5000.0000 [IU] | Freq: Three times a day (TID) | INTRAMUSCULAR | Status: DC
  Administered 2016-01-26 – 2016-01-27 (×3): 5000 [IU] via SUBCUTANEOUS
  Filled 2016-01-26 (×3): qty 1

## 2016-01-26 NOTE — ED Notes (Signed)
Attempted to call report x 1  

## 2016-01-26 NOTE — ED Notes (Signed)
Per EDP who has spoken to pt's daughter, pt is to remain DNR. Family does not want aggressive measures including CVL/pressors. Family is okay with fluid resuscitation and antibiotics. See MD note.

## 2016-01-26 NOTE — ED Provider Notes (Signed)
[facility] Emergency Department Provider Note  ____________________________________________  Time seen: Approximately 12:10 PM  I have reviewed the triage vital signs and the nursing notes.   HISTORY  Chief Complaint Weakness  History is limited by the patient's severe chronic dementia  HPI Glen Frazier is a 82 y.o. male who presents by EMS from [facility] skilled nursing facility with a chief complaintincreased confusion and generalized weakness.  Reportedly the patient was found this morning "covered in vomit and feces".  He is awake and somewhat coherent with a few sentences making sense, usually referring to his time in the [place] War.  He does not appear to be in acute distress but his vital signs are abnormal with a heart rate of greater than 90, respiratory rate of 26, and borderline hypothermic with a rectal temperature of 96.6.  He also appears dry and disheveled.  No additional history is available at this time but he does come with documentation confirming that he is DO NOT RESUSCITATE/DO NOT INTUBATE.   Past Medical History  Diagnosis Date  . Allergy   . Hyperlipidemia   . Hypertension   . Vertigo   . Glaucoma   . Chronic back pain   . Anxiety   . Alzheimer's dementia   . Pilonidal cyst   . Brainstem infarct, acute [facility])     Patient Active Problem List   Diagnosis Date Noted  . ALZHEIMERS DISEASE 04/22/2010  . ABNORMAL HEART RHYTHMS 01/04/2009  . ANXIETY 01/25/2008  . CVA 01/25/2008  . HYPERLIPIDEMIA 11/26/2006  . GLAUCOMA 11/26/2006  . HYPERTENSION 11/26/2006  . ALLERGIC RHINITIS 11/26/2006  . VERTIGO 11/26/2006    Past Surgical History  Procedure Laterality Date  . Cervical disc surgery      Current Outpatient Rx  Name  Route  Sig  Dispense  Refill  . aspirin EC 81 MG tablet   Oral   Take 81 mg by mouth daily.         Marland Kitchen donepezil (ARICEPT) 5 MG tablet   Oral   Take 1 tablet (5 mg total) by mouth at bedtime as  needed.   30 tablet   11   . hydrochlorothiazide (MICROZIDE) 12.5 MG capsule   Oral   Take 1 capsule (12.5 mg total) by mouth daily.   30 capsule   11   . latanoprost (XALATAN) 0.005 % ophthalmic solution   Both Eyes   Place 1 drop into both eyes daily.           Marland Kitchen levothyroxine (SYNTHROID, LEVOTHROID) 25 MCG tablet   Oral   Take 25 mcg by mouth daily before breakfast.         . lisinopril (PRINIVIL,ZESTRIL) 10 MG tablet   Oral   Take 1 tablet (10 mg total) by mouth daily.   30 tablet   11   . lovastatin (MEVACOR) 40 MG tablet   Oral   Take 1 tablet (40 mg total) by mouth at bedtime.   30 tablet   11   . meclizine (ANTIVERT) 25 MG tablet   Oral   Take 0.5 tablets (12.5 mg total) by mouth 3 (three) times daily as needed.   90 tablet   1   . sertraline (ZOLOFT) 100 MG tablet   Oral   Take 1 tablet (100 mg total) by mouth daily.   30 tablet   11   . timolol (TIMOPTIC) 0.5 % ophthalmic solution   Both Eyes   Place 1  drop into both eyes daily.            Allergies Paroxetine and Penicillins  Family History  Problem Relation Age of Onset  . Coronary artery disease Neg Hx     Social History Social History  Substance Use Topics  . Smoking status: Former Games developer  . Smokeless tobacco: Never Used  . Alcohol Use: No    Review of Systems  Able to obtain due to the patient's chronic dementia  ____________________________________________   PHYSICAL EXAM:  VITAL SIGNS: ED Triage Vitals  Enc Vitals Group     BP 01/26/16 1138 118/98 mmHg     Pulse Rate 01/26/16 1138 90     Resp 01/26/16 1138 26     Temp 01/26/16 1138 96.6 F (35.9 C)     Temp Source 01/26/16 1138 Rectal     SpO2 01/26/16 1138 97 %     Weight 01/26/16 1138 150 lb (68.04 kg)     Height 01/26/16 1138 5\' 10"  (1.778 m)     Head Cir --      Peak Flow --      Pain Score --      Pain Loc --      Pain Edu? --      Excl. in GC? --     Constitutional: Awake, disheveled, chronically  ill-appearing, dried emesis around his mouth Eyes: Conjunctivae are normal. PERRL. EOMI. Head: Atraumatic. Nose: No congestion/rhinnorhea. Mouth/Throat: Mucous membranes are dry. Neck: No stridor.  No meningeal signs.   Cardiovascular: Borderline tachycardia, regular rhythm. Good peripheral circulation. Grossly normal heart sounds.   Respiratory: Normal respiratory effort but with tachypnea.  No retractions. Lungs CTAB. Gastrointestinal: Soft with mild generalized tenderness to palpation but no guarding. No distention.  Strongly heme positive stool with quality control passed though the stool is brown and loose Musculoskeletal: No lower extremity tenderness nor edema. No gross deformities of extremities. Neurologic: The patient has no gross cranial nerve deficits and is moving all 4 extremities.  Skin:  Skin is warm, dry and intact. No rash noted.   ____________________________________________   LABS (all labs ordered are listed, but only abnormal results are displayed)  Labs Reviewed  COMPREHENSIVE METABOLIC PANEL - Abnormal; Notable for the following:    Sodium 147 (*)    Potassium 3.1 (*)    CO2 18 (*)    Glucose, Bld 210 (*)    BUN 44 (*)    Creatinine, Ser 1.79 (*)    Calcium 8.8 (*)    AST 45 (*)    ALT 14 (*)    GFR calc non Af Amer 33 (*)    GFR calc Af Amer 38 (*)    Anion gap 20 (*)    All other components within normal limits  LIPASE, BLOOD - Abnormal; Notable for the following:    Lipase 69 (*)    All other components within normal limits  TROPONIN I - Abnormal; Notable for the following:    Troponin I 0.05 (*)    All other components within normal limits  CBC WITH DIFFERENTIAL/PLATELET - Abnormal; Notable for the following:    WBC 13.0 (*)    MCHC 31.9 (*)    RDW 14.9 (*)    Neutro Abs 11.8 (*)    Lymphs Abs 0.6 (*)    All other components within normal limits  PROTIME-INR - Abnormal; Notable for the following:    Prothrombin Time 17.3 (*)    All other  components within  normal limits  URINALYSIS COMPLETEWITH MICROSCOPIC ([facility] ONLY) - Abnormal; Notable for the following:    Color, Urine YELLOW (*)    APPearance CLEAR (*)    Protein, ur 100 (*)    Bacteria, UA RARE (*)    Squamous Epithelial / LPF 0-5 (*)    All other components within normal limits  LACTIC ACID, PLASMA - Abnormal; Notable for the following:    Lactic Acid, Venous 9.6 (*)    All other components within normal limits  URINE CULTURE  CULTURE, BLOOD (ROUTINE X 2)  CULTURE, BLOOD (ROUTINE X 2)  APTT  LACTIC ACID, PLASMA   ____________________________________________  EKG  ED ECG REPORT I, Evangelos Paulino, the attending physician, personally viewed and interpreted this ECG.   Date: 01/26/2016  EKG Time: 11:40  Rate: 96  Rhythm: Difficult to interpret, possibly atrial fibrillation but also, given the presence of P waves, more likely to be sinus with frequent PVCs  Axis: Normal  Intervals:frequent PVCs, right bundle branch block and left posterior fascicular block is also possible  ST&T Change: Non-specific ST segment / T-wave changes, possible acute ischemia but does not meet STEMI criteria  ____________________________________________  RADIOLOGY   Ct Abdomen Pelvis Wo Contrast  01/26/2016  CLINICAL DATA:  Increased confusion and weakness. Found in bed this morning covered involvement and feces. Dementia. EXAM: CT ABDOMEN AND PELVIS WITHOUT CONTRAST TECHNIQUE: Multidetector CT imaging of the abdomen and pelvis was performed following the standard protocol without IV contrast. COMPARISON:  None. FINDINGS: Lung bases demonstrate airspace opacification over the posterior left lower lobe which may be due to pneumonia versus atelectasis. Mild linear atelectasis/ scarring over the posterior right lower lobe. Calcified plaque over the right coronary artery. Abdominal images demonstrate the liver, spleen, pancreas, gallbladder and adrenal glands to be within normal. Kidneys  are normal in size without hydronephrosis or nephrolithiasis. Exophytic 1.3 cm hypodensity over the upper pole left kidney with Hounsfield unit measurements 19 likely slightly hyperdense cyst. Sub cm hypodensity over the lower pole left renal cortex too small to characterize but likely a cyst. Ureters are within normal. There is moderate calcified plaque over the abdominal aorta and iliac arteries. Stomach is within normal. Small bowel is within normal. Appendix is not visualized. There is a redundant sigmoid colon with wall thickening and adjacent inflammatory change of the sigmoid colon with this wall thickening extending to the rectum with there is moderate distention fecal retention/impaction. Findings likely due to an acute colitis/proctitis. No evidence of obstruction or free peritoneal air. Remaining pelvic images demonstrate a left inguinal hernia containing a short segment of small bowel. The bladder and prostate are within normal. There is subacute fracture of the left inferior pubic ramus. There are degenerate changes of the spine and hips. IMPRESSION: Wall thickening with adjacent inflammatory change involving the rectum and sigmoid colon likely an acute colitis/proctitis. Moderate fecal retention/ impaction with distention of the rectum. Left inguinal hernia containing a short segment of small bowel. No incarceration or obstruction. Airspace opacification over the left lower lobe which may be due to a pneumonia. Linear scarring/atelectasis right lower lobe. 1.3 cm hypodensity over the mid to upper pole left kidney likely a slightly hyperdense cyst. Sub cm hypodensity over the lower pole left kidney too small to characterize but likely cyst. Subacute fracture left inferior pubic ramus. Electronically Signed   By: Elberta Fortis M.D.   On: 01/26/2016 14:29   Dg Chest Port 1 View  01/26/2016  CLINICAL DATA:  Patient with  history of sepsis. Confusion and weakness. EXAM: PORTABLE CHEST 1 VIEW COMPARISON:   Chest radiograph 03/21/2007. FINDINGS: Multiple monitoring leads overlie the patient. Stable cardiac and mediastinal contours. Pulmonary hyperinflation. Possible nodular opacity right lung base. No consolidative pulmonary opacities. No pleural effusion or pneumothorax. Regional skeleton is unremarkable. IMPRESSION: No acute cardiopulmonary process. Possible nodular opacity right lung base. Recommend correlation with chest CT in the non acute setting to exclude the possibility of pulmonary nodule. Electronically Signed   By: Annia Beltrew  Davis M.D.   On: 01/26/2016 12:26    ____________________________________________   PROCEDURES  Procedure(s) performed:   .Critical Care Performed by: Loleta RoseFORBACH, Yuniel Blaney Authorized by: Loleta RoseFORBACH, Sanaai Doane Total critical care time: 60 minutes Critical care time was exclusive of separately billable procedures and treating other patients. Critical care was time spent personally by me on the following activities: development of treatment plan with patient or surrogate, discussions with consultants, evaluation of patient's response to treatment, examination of patient, obtaining history from patient or surrogate, ordering and performing treatments and interventions, ordering and review of laboratory studies, ordering and review of radiographic studies, pulse oximetry, re-evaluation of patient's condition and review of old charts.   ____________________________________________   INITIAL IMPRESSION / ASSESSMENT AND PLAN / ED COURSE  Pertinent labs & imaging results that were available during my care of the patient were reviewed by me and considered in my medical decision making (see chart for details).  I initiated code sepsis after seeing the patient's abnormal vital signs.  We will perform the standard septic workup including an in out catheterization for urine, cultures, lab work including a lactic acid, IV fluids with a target of 30 mL/kg, empiric antibiotics as recommended for  patients with a documented penicillin allergy.  After more information is available I will attempt to touch base with his power of attorney, but the patient will need to be admitted.  ----------------------------------------- 2:57 PM on 01/26/2016 -----------------------------------------  I have spoken with the patient's daughter/power of attorney 3 times by phone to give her updates.  He remains in mild discomfort that seems to be coming from his back, which she described as occurring in the [place].  His workup is notable for multisystem organ dysfunction likely due to sepsis from acute colitis/proctitis.  A near-complete list of diagnoses and clinical impressions as listed below.  After speaking with the patient's daughter and confirming that she and her brother do not want to pursue any measures beyond fluid resuscitation and antibiotics, including cervically saying note to a central line and pressors, I spoke with the hospitalist for admission.  The patient's daughter understands that he may deteriorate and that this admission may indicate the end of his life, and she is comfortable with that under the circumstances and describes that he has a very poor quality of life at baseline and that he would not want to be kept alive artificially even with pressors.  ____________________________________________  FINAL CLINICAL IMPRESSION(S) / ED DIAGNOSES  Final diagnoses:  Sepsis, due to unspecified organism [facility](HCC)  Gastrointestinal hemorrhage, unspecified gastritis, unspecified gastrointestinal hemorrhage type  Alzheimer's disease of other onset  Non-intractable vomiting with nausea, vomiting of unspecified type  Elevated lactic acid level  Elevated troponin I level  Acute renal failure, unspecified acute renal failure type (HCC)  Dehydration  Malnutrition (HCC)  Acute colitis  Hypokalemia  Pubic ramus fracture, left, closed, initial encounter (HCC)     MEDICATIONS GIVEN DURING THIS  VISIT:  Medications  sodium chloride 0.9 % bolus 1,000 mL (  0 mLs Intravenous Stopped 01/26/16 1251)    And  sodium chloride 0.9 % bolus 1,000 mL (0 mLs Intravenous Stopped 01/26/16 1259)    And  sodium chloride 0.9 % bolus 250 mL (0 mLs Intravenous Stopped 01/26/16 1407)  levofloxacin (LEVAQUIN) IVPB 750 mg (0 mg Intravenous Stopped 01/26/16 1351)  aztreonam (AZACTAM) 2 g in dextrose 5 % 50 mL IVPB (0 g Intravenous Stopped 01/26/16 1259)  vancomycin (VANCOCIN) IVPB 1000 mg/200 mL premix (0 mg Intravenous Stopped 01/26/16 1359)  morphine 4 MG/ML injection 4 mg (4 mg Intravenous Given 01/26/16 1305)  ondansetron (ZOFRAN) injection 4 mg (4 mg Intravenous Given 01/26/16 1305)     NEW OUTPATIENT MEDICATIONS STARTED DURING THIS VISIT:  New Prescriptions   No medications on file      Note:  This document was prepared using Dragon voice recognition software and may include unintentional dictation errors.   Loleta Rose, MD 01/26/16 2101

## 2016-01-26 NOTE — ED Notes (Signed)
Called code sepsis to [facility]  [id number]

## 2016-01-26 NOTE — ED Notes (Signed)
Patient transported to CT 

## 2016-01-26 NOTE — ED Notes (Signed)
Blood cultures drawn prior to starting abt.

## 2016-01-26 NOTE — ED Notes (Signed)
Pt presents to EDd via EMS from [facility] c/o increased confusion and weakness per SNF nursing staff. SNF staff states pt was found this morning in bed covered in vomit and feces. EMS reports strong urine smell in room. Pt is HOH and has hx dementia, does not answer questions.

## 2016-01-26 NOTE — H&P (Signed)
[facility]   PATIENT NAME: Glen Frazier    MR#:  [id number]  DATE OF BIRTH:  [birth year]  DATE OF ADMISSION:  01/26/2016  PRIMARY CARE PHYSICIAN: Tillman Abide, MD   REQUESTING/REFERRING PHYSICIAN: Dr. Loleta Rose  CHIEF COMPLAINT:   Chief Complaint  Patient presents with  . Weakness    HISTORY OF PRESENT ILLNESS:  Glen Frazier  is a 82 y.o. male with a known history of Alzheimer's dementia, glaucoma, hypertension, hyperlipidemia, history of anxiety who presents to the hospital due to generalized weakness and noted to be septic. Patient has significant dementia and is very hard of hearing different most history obtained from the chart and from the ER physician. Patient presented to the hospital due to altered mental status and weakness and noted to have significant electrolyte abnormalities with acute renal failure, hyperkalemia, hypernatremia and also noted to have an elevated lactate of 9.6. Patient was also having significant diarrhea and underwent a CT scan of the abdomen and pelvis shows nonspecific colitis. Patient was also noted to have a possible left lower lobe pneumonia based on the CT scan. Given his CT scan findings elevated lactate he was thought to be in acute sepsis and therefore hospitalist services were contacted further treatment and evaluation. Patient is a DO NOT RESUSCITATE and the family does not want any aggressive intervention at this time and no vasopressors or even a central line. He is therefore being admitted to a medical floor with supportive care with IV antibiotics and IV fluids presently.  PAST MEDICAL HISTORY:   Past Medical History  Diagnosis Date  . Allergy   . Hyperlipidemia   . Hypertension   . Vertigo   . Glaucoma   . Chronic back pain   . Anxiety   . Alzheimer's dementia   . Pilonidal cyst   . Brainstem infarct, acute (HCC)     PAST SURGICAL HISTORY:   Past Surgical History  Procedure  Laterality Date  . Cervical disc surgery      SOCIAL HISTORY:   Social History  Substance Use Topics  . Smoking status: Former Games developer  . Smokeless tobacco: Never Used  . Alcohol Use: No    FAMILY HISTORY:   Family History  Problem Relation Age of Onset  . Coronary artery disease Neg Hx   . Prostate cancer Father     DRUG ALLERGIES:   Allergies  Allergen Reactions  . Paroxetine     REACTION: syncope, shakes  . Penicillins Other (See Comments)    REACTION: not specified on MAR.    REVIEW OF SYSTEMS:   Review of Systems  Unable to perform ROS   MEDICATIONS AT HOME:   Prior to Admission medications   Medication Sig Start Date End Date Taking? Authorizing Provider  acetaminophen (TYLENOL) 650 MG CR tablet Take 650 mg by mouth 2 (two) times daily.   Yes Historical Provider, MD  aspirin 81 MG chewable tablet Chew 81 mg by mouth daily.   Yes Historical Provider, MD  donepezil (ARICEPT) 10 MG tablet Take 10 mg by mouth daily.   Yes Historical Provider, MD  latanoprost (XALATAN) 0.005 % ophthalmic solution Place 1 drop into both eyes every evening.    Yes Historical Provider, MD  levothyroxine (SYNTHROID, LEVOTHROID) 25 MCG tablet Take 25 mcg by mouth daily before breakfast.   Yes Historical Provider, MD  lisinopril (PRINIVIL,ZESTRIL) 10 MG tablet Take 1 tablet (10 mg total) by mouth daily. 03/31/11  Yes Richard  Clayborn Bigness, MD  meclizine (ANTIVERT) 12.5 MG tablet Take 12.5 mg by mouth 3 (three) times daily as needed for dizziness.   Yes Historical Provider, MD  memantine (NAMENDA) 10 MG tablet Take 10 mg by mouth 2 (two) times daily.   Yes Historical Provider, MD  sertraline (ZOLOFT) 50 MG tablet Take 150 mg by mouth daily. *Note dose*   Yes Historical Provider, MD  timolol (TIMOPTIC) 0.5 % ophthalmic solution Place 1 drop into both eyes daily.  01/23/11  Yes Historical Provider, MD      VITAL SIGNS:  Blood pressure 97/52, pulse 60, temperature 96.6 F (35.9 C), temperature  source Rectal, resp. rate 28, height 5\' 10"  (1.778 m), weight 68.04 kg (150 lb), SpO2 97 %.  PHYSICAL EXAMINATION:  Physical Exam  GENERAL:  82 y.o.-year-old patient cachectic male lying in the bed in no acute distress.  EYES: Pupils equal, round, reactive to light and accommodation. No scleral icterus. Extraocular muscles intact.  HEENT: Head atraumatic, normocephalic. Oropharynx and nasopharynx clear. No oropharyngeal erythema, dry oral mucosa. NECK:  Supple, no jugular venous distention. No thyroid enlargement, no tenderness.  LUNGS: Normal breath sounds bilaterally, no wheezing, rales, rhonchi. No use of accessory muscles of respiration.  CARDIOVASCULAR: S1, S2 RRR. No murmurs, rubs, gallops, clicks.  ABDOMEN: Soft, nontender, nondistended. Bowel sounds present. Voluntary guarding. No organomegaly or mass.  EXTREMITIES: No pedal edema, cyanosis, or clubbing. + 2 pedal & radial pulses b/l.   NEUROLOGIC: Cranial nerves II through XII are intact. No focal Motor or sensory deficits appreciated b/l.  Globally weak.   PSYCHIATRIC: The patient is alert and oriented x 1. SKIN: No obvious rash, lesion, or ulcer.   LABORATORY PANEL:   CBC  Recent Labs Lab 01/26/16 1224  WBC 13.0*  HGB 13.9  HCT 43.6  PLT 305   ------------------------------------------------------------------------------------------------------------------  Chemistries   Recent Labs Lab 01/26/16 1224  NA 147*  K 3.1*  CL 109  CO2 18*  GLUCOSE 210*  BUN 44*  CREATININE 1.79*  CALCIUM 8.8*  AST 45*  ALT 14*  ALKPHOS 91  BILITOT 1.0   ------------------------------------------------------------------------------------------------------------------  Cardiac Enzymes  Recent Labs Lab 01/26/16 1224  TROPONINI 0.05*   ------------------------------------------------------------------------------------------------------------------  RADIOLOGY:  Ct Abdomen Pelvis Wo Contrast  01/26/2016  CLINICAL DATA:   Increased confusion and weakness. Found in bed this morning covered involvement and feces. Dementia. EXAM: CT ABDOMEN AND PELVIS WITHOUT CONTRAST TECHNIQUE: Multidetector CT imaging of the abdomen and pelvis was performed following the standard protocol without IV contrast. COMPARISON:  None. FINDINGS: Lung bases demonstrate airspace opacification over the posterior left lower lobe which may be due to pneumonia versus atelectasis. Mild linear atelectasis/ scarring over the posterior right lower lobe. Calcified plaque over the right coronary artery. Abdominal images demonstrate the liver, spleen, pancreas, gallbladder and adrenal glands to be within normal. Kidneys are normal in size without hydronephrosis or nephrolithiasis. Exophytic 1.3 cm hypodensity over the upper pole left kidney with Hounsfield unit measurements 19 likely slightly hyperdense cyst. Sub cm hypodensity over the lower pole left renal cortex too small to characterize but likely a cyst. Ureters are within normal. There is moderate calcified plaque over the abdominal aorta and iliac arteries. Stomach is within normal. Small bowel is within normal. Appendix is not visualized. There is a redundant sigmoid colon with wall thickening and adjacent inflammatory change of the sigmoid colon with this wall thickening extending to the rectum with there is moderate distention fecal retention/impaction. Findings likely due to an  acute colitis/proctitis. No evidence of obstruction or free peritoneal air. Remaining pelvic images demonstrate a left inguinal hernia containing a short segment of small bowel. The bladder and prostate are within normal. There is subacute fracture of the left inferior pubic ramus. There are degenerate changes of the spine and hips. IMPRESSION: Wall thickening with adjacent inflammatory change involving the rectum and sigmoid colon likely an acute colitis/proctitis. Moderate fecal retention/ impaction with distention of the rectum. Left  inguinal hernia containing a short segment of small bowel. No incarceration or obstruction. Airspace opacification over the left lower lobe which may be due to a pneumonia. Linear scarring/atelectasis right lower lobe. 1.3 cm hypodensity over the mid to upper pole left kidney likely a slightly hyperdense cyst. Sub cm hypodensity over the lower pole left kidney too small to characterize but likely cyst. Subacute fracture left inferior pubic ramus. Electronically Signed   By: Elberta Fortis M.D.   On: 01/26/2016 14:29   Dg Chest Port 1 View  01/26/2016  CLINICAL DATA:  Patient with history of sepsis. Confusion and weakness. EXAM: PORTABLE CHEST 1 VIEW COMPARISON:  Chest radiograph 03/21/2007. FINDINGS: Multiple monitoring leads overlie the patient. Stable cardiac and mediastinal contours. Pulmonary hyperinflation. Possible nodular opacity right lung base. No consolidative pulmonary opacities. No pleural effusion or pneumothorax. Regional skeleton is unremarkable. IMPRESSION: No acute cardiopulmonary process. Possible nodular opacity right lung base. Recommend correlation with chest CT in the non acute setting to exclude the possibility of pulmonary nodule. Electronically Signed   By: Annia Belt M.D.   On: 01/26/2016 12:26     IMPRESSION AND PLAN:   82 year old male with past medical history of advanced Alzheimer's dementia, hypertension, glaucoma, adult failure to thrive who presented to the hospital due to weakness and altered mental status and noted to be in sepsis.  1. Altered mental status-this is likely metabolic encephalopathy secondary to sepsis. -Patient also has underlying profound dementia. Continue treat underlying sepsis with IV antibiotics, IV fluids. And follow mental status.  2. Sepsis- pt. Ruled in given elevated lactate, leukocytosis, CT scan findings suggestive of colitis/pneumonia. -I will treat patient with broad-spectrum IV antibiotics with vancomycin, Levaquin, aztreonam.  Aggressive IV fluid resuscitation, follow lactate follow hemodynamics. - follow blood cultures.   3. Colitis-nonspecific and noted on CT. Patient is having some diarrhea. -Questionable infectious versus inflammatory. I will check a gastrointestinal PCR. -Continue broad-spectrum IV antibiotics for the sepsis, if patient does have C. difficile colitis we'll narrow antibiotics. Place on clear liquid diet. Continue supportive care with IV fluids, antiemetics. -If C. difficile PCR is negative I will place the patient on some when necessary Imodium.  4. Pneumonia-incidentally noted on the CT scan.  - clinically pt. Is not having any acute resp. Symptoms.  - cont. IV Vanco, Aztreonam, Levaquin.   5. Acute kidney injury-secondary to sepsis. Hydrate with IV fluids. Follow BUN and creatinine.  6. Hypokalemia-we'll supplement and repeat level in the morning. -Check magnesium level.  7. Elevated troponin-likely demand ischemia secondary to sepsis. I will not cycle his cardiac markers patient is a DO NOT RESUSCITATE.  8. Leukocytosis-secondary to sepsis. Will follow with IV antibiotic therapy.   Patient is a DO NOT INTUBATE DO NOT RESUSCITATE. The ER physician spoke to the patient's POA does not want any vasopressors or escalation of therapy and therefore we'll continue supportive care as mentioned.  All the records are reviewed and case discussed with ED provider. Management plans discussed with the patient, family and they are in agreement.  CODE STATUS: DO NOT RESUSCITATE  TOTAL TIME TAKING CARE OF THIS PATIENT: 50 minutes.    Houston SirenSAINANI,VIVEK J M.D on 01/26/2016 at 3:22 PM  Between 7am to 6pm - Pager - [phone]  After 6pm go to [email or web address] - password [email or web address] [facility]  [facility]  Office  [phone]  CC: Primary care physician; Tillman Abideichard Letvak, MD

## 2016-01-26 NOTE — Consult Note (Signed)
Pharmacy Antibiotic Note  Glen Frazier is a 82 y.o. male admitted on 01/26/2016 with sepsis.  Pharmacy has been consulted for aztreonam, levofloxacin and vancomycin dosing.  Plan: Pt received 1g of vancomycin in ED. Will start vancomycin 750mg  q 24 hours 8 hours after initial dose for stacked dosing. Vancomycin 750 IV every 24 hours.  Goal trough 15-20 mcg/mL. aztreonam 1g q 8 hours, levofloxacin 750mg  q 48 hours Vancomycin trough at steady state 6/21 @ 2030  Height: 5\' 10"  (177.8 cm) Weight: 150 lb (68.04 kg) IBW/kg (Calculated) : 73  Temp (24hrs), Avg:97.3 F (36.3 C), Min:96.6 F (35.9 C), Max:98 F (36.7 C)   Recent Labs Lab 01/26/16 1223 01/26/16 1224 01/26/16 1450  WBC  --  13.0*  --   CREATININE  --  1.79*  --   LATICACIDVEN 9.6*  --  5.4*    Estimated Creatinine Clearance: 29 mL/min (by C-G formula based on Cr of 1.79).    Allergies  Allergen Reactions  . Paroxetine     REACTION: syncope, shakes  . Penicillins Other (See Comments)    REACTION: not specified on MAR.    Antimicrobials this admission: aztreonam 6/17 >>  vancomycin 6/17 >>  Levofloxacin 6/17>>  Dose adjustments this admission:   Microbiology results: 6/17 BCx: pending 6/17 UCx: pending   6/17 MRSA PCR: needs to be collected 6/17 Cdiff: needs to be collected GI panel: needed to be collected  Thank you for allowing pharmacy to be a part of this patient's care.  Olene FlossMelissa D Starlena Beil, Pharm.D Clinical Pharmacist   01/26/2016 4:12 PM

## 2016-01-27 DIAGNOSIS — L8990 Pressure ulcer of unspecified site, unspecified stage: Secondary | ICD-10-CM | POA: Insufficient documentation

## 2016-01-27 LAB — BASIC METABOLIC PANEL
ANION GAP: 12 (ref 5–15)
BUN: 58 mg/dL — ABNORMAL HIGH (ref 6–20)
CALCIUM: 7.3 mg/dL — AB (ref 8.9–10.3)
CO2: 18 mmol/L — ABNORMAL LOW (ref 22–32)
CREATININE: 1.84 mg/dL — AB (ref 0.61–1.24)
Chloride: 113 mmol/L — ABNORMAL HIGH (ref 101–111)
GFR, EST AFRICAN AMERICAN: 37 mL/min — AB (ref >60)
GFR, EST NON AFRICAN AMERICAN: 32 mL/min — AB (ref >60)
GLUCOSE: 83 mg/dL (ref 65–99)
Potassium: 4 mmol/L (ref 3.5–5.1)
Sodium: 143 mmol/L (ref 135–145)

## 2016-01-27 LAB — CBC
HCT: 37 % — ABNORMAL LOW (ref 40.0–52.0)
HEMOGLOBIN: 12.3 g/dL — AB (ref 13.0–18.0)
MCH: 29.9 pg (ref 26.0–34.0)
MCHC: 33.4 g/dL (ref 32.0–36.0)
MCV: 89.7 fL (ref 80.0–100.0)
PLATELETS: 231 10*3/uL (ref 150–440)
RBC: 4.12 MIL/uL — ABNORMAL LOW (ref 4.40–5.90)
RDW: 14.3 % (ref 11.5–14.5)
WBC: 7.3 10*3/uL (ref 3.8–10.6)

## 2016-01-27 LAB — URINE CULTURE: Culture: <10000 — AB

## 2016-01-27 LAB — LACTIC ACID, PLASMA: LACTIC ACID, VENOUS: 2.5 mmol/L — AB (ref 0.5–2.0)

## 2016-01-27 MED ORDER — NYSTATIN-TRIAMCINOLONE 100000-0.1 UNIT/GM-% EX CREA
TOPICAL_CREAM | Freq: Two times a day (BID) | CUTANEOUS | Status: DC
  Administered 2016-01-27: 11:00:00 via TOPICAL
  Filled 2016-01-27: qty 15

## 2016-01-27 MED ORDER — METRONIDAZOLE IN NACL 5-0.79 MG/ML-% IV SOLN
500.0000 mg | Freq: Three times a day (TID) | INTRAVENOUS | Status: DC
  Administered 2016-01-27 – 2016-01-28 (×2): 500 mg via INTRAVENOUS
  Filled 2016-01-27 (×6): qty 100

## 2016-01-27 MED ORDER — SODIUM CHLORIDE 0.9 % IV BOLUS (SEPSIS)
500.0000 mL | Freq: Once | INTRAVENOUS | Status: AC
  Administered 2016-01-27: 500 mL via INTRAVENOUS

## 2016-01-27 NOTE — Progress Notes (Signed)
LCSW provided support to patients daughter and discussed the benefits of palliative consult and Hospice Care and family is agreeable for Hospice Care for their father at this time.  Delta Air LinesClaudine Addaleigh Nicholls LCSW [phone]

## 2016-01-27 NOTE — Progress Notes (Signed)
bp 80/52. Alert. No u/o output. Bladder scan reveals 193. Dr Nemiah Commanderkalisetti orders 500cc ns bolus. Family has visit and spoke to md. Reports no extreme measures  Need to be done. Pt is a DNR/RMP

## 2016-01-27 NOTE — Clinical Social Work Note (Addendum)
SonClinical Social Work Assessment  Patient Details  Name: Glen Frazier MRN: [id number] Date of Birth: 08/14/1927  Date of referral:  01/27/16               Reason for consult:  Facility Placement                Permission sought to share information with:  Family Supports, Customer service manager Permission granted to share information::     Name::     Glen Frazier [phone]   [name] daughter [phone]  Agency::  [facility]  Relationship::     Contact Information:     Housing/Transportation Living arrangements for the past 2 months:  [place] of Information:  Facility Patient Interpreter Needed:  None (Patient hard of Hearing and non Publishing rights manager) Criminal Activity/Legal Involvement Pertinent to Current Situation/Hospitalization:  No - Comment as needed Significant Relationships:  Adult Children Lives with:  Facility Resident Do you feel safe going back to the place where you live?  Yes Need for family participation in patient care:  Yes (Comment)  Care giving concerns:  Family understands he is failing to thrive and has lost a lot of weight. Patients Family would like a Palliative CONSULT and they are interested in hospice services. Family does not wish to have feeder tube or life support measures for their father and ensured we were aware of DNR  Social Worker assessment / plan:  LCSW met with patient and greeted and introduced myself. Patient is hard of hearing and has Alzheimer Disease. Patient resides at [facility] and reported to family pt has been in rough shape according to his son Harrie Jeans [phone] patient is declining and there is a DNR at [facility] facility. Son reports he is 75 and so is his Sister Ms [name]. Palliative consult should be considered if patients declines. LCSW provided support and received information to complete this assessment. Patient is insured at  [employer]. Patient is on a low  sodium diet and uses a wheelchair and requires full assistance with all his ADL's and has resided at [facility] for 4 years in [place].  Employment status:  Retired Forensic scientist:  Chief Operating Officer) PT Recommendations:   TBD Information / Referral to community resources:  [place]  Patient/Family's Response to care: They understand pt may be declining and are open for additional support.  Patient/Family's Understanding of and Emotional Response to Diagnosis, Current Treatment, and Prognosis:  They have a good understanding and will consult medical staff  Emotional Assessment Appearance:  Appears stated age Attitude/Demeanor/Rapport:    Affect (typically observed):  Calm, Unable to Assess (patient hard of hearing and non verbal- he was able to nod his head iwhen simple questions asked) Orientation:  Oriented to Self Alcohol / Substance use:  Other, Never Used Psych involvement (Current and /or in the community):  No (Comment)  Discharge Needs  Concerns to be addressed:  Care Coordination Readmission within the last 30 days:  No Current discharge risk:  Cognitively Impaired Barriers to Discharge:  No Barriers Identified   Joana Reamer, LCSW 01/27/2016, 2:48 PM

## 2016-01-27 NOTE — Progress Notes (Signed)
[facility] [place] at [facility]   PATIENT NAME: Glen Frazier    MR#:  [id number]  DATE OF BIRTH:  03/18/1928  SUBJECTIVE:  CHIEF COMPLAINT:   Chief Complaint  Patient presents with  . Weakness   -   REVIEW OF SYSTEMS:  Review of Systems  Unable to perform ROS: dementia    DRUG ALLERGIES:   Allergies  Allergen Reactions  . Paroxetine     REACTION: syncope, shakes  . Penicillins Other (See Comments)    REACTION: not specified on MAR.    VITALS:  Blood pressure 108/59, pulse 72, temperature 98.4 F (36.9 C), temperature source Oral, resp. rate 16, height  (1.702 m), weight 52.164 kg (115 lb), SpO2 98 %.  PHYSICAL EXAMINATION:  Physical Exam  GENERAL:  82 y.o.-year-old patient lying in the bed with no acute distress.  EYES: Pupils equal, round, reactive to light and accommodation. No scleral icterus. Extraocular muscles intact.  HEENT: Head atraumatic, normocephalic. Oropharynx and nasopharynx clear.  NECK:  Supple, no jugular venous distention. No thyroid enlargement, no tenderness.  LUNGS: Normal breath sounds bilaterally, no wheezing, rales,rhonchi or crepitation. No use of accessory muscles of respiration. Decreased bibasilar breath sounds CARDIOVASCULAR: S1, S2 normal. No murmurs, rubs, or gallops.  ABDOMEN: Soft, tender left lower quadrant with voluntary guarding, nondistended. Bowel sounds present. No organomegaly or mass.  EXTREMITIES: No pedal edema, cyanosis, or clubbing.  Erythematous, tender scrotum and penis tip NEUROLOGIC:  Alert, nodding head to questions occasionally, minimally verbal, global weakness Unable to assess otherwise neurologically due to severe dementia PSYCHIATRIC: The patient is alert but not oriented, dementia at baseline  SKIN: erythematous scrotum    LABORATORY PANEL:   CBC  Recent Labs Lab 01/27/16 0424  WBC 7.3  HGB 12.3*  HCT 37.0*  PLT 231    ------------------------------------------------------------------------------------------------------------------  Chemistries   Recent Labs Lab 01/26/16 1224 01/27/16 0424  NA 147* 143  K 3.1* 4.0  CL 109 113*  CO2 18* 18*  GLUCOSE 210* 83  BUN 44* 58*  CREATININE 1.79* 1.84*  CALCIUM 8.8* 7.3*  AST 45*  --   ALT 14*  --   ALKPHOS 91  --   BILITOT 1.0  --    ------------------------------------------------------------------------------------------------------------------  Cardiac Enzymes  Recent Labs Lab 01/26/16 1224  TROPONINI 0.05*   ------------------------------------------------------------------------------------------------------------------  RADIOLOGY:  Ct Abdomen Pelvis Wo Contrast  01/26/2016  CLINICAL DATA:  Increased confusion and weakness. Found in bed this morning covered involvement and feces. Dementia. EXAM: CT ABDOMEN AND PELVIS WITHOUT CONTRAST TECHNIQUE: Multidetector CT imaging of the abdomen and pelvis was performed following the standard protocol without IV contrast. COMPARISON:  None. FINDINGS: Lung bases demonstrate airspace opacification over the posterior left lower lobe which may be due to pneumonia versus atelectasis. Mild linear atelectasis/ scarring over the posterior right lower lobe. Calcified plaque over the right coronary artery. Abdominal images demonstrate the liver, spleen, pancreas, gallbladder and adrenal glands to be within normal. Kidneys are normal in size without hydronephrosis or nephrolithiasis. Exophytic 1.3 cm hypodensity over the upper pole left kidney with Hounsfield unit measurements 19 likely slightly hyperdense cyst. Sub cm hypodensity over the lower pole left renal cortex too small to characterize but likely a cyst. Ureters are within normal. There is moderate calcified plaque over the abdominal aorta and iliac arteries. Stomach is within normal. Small bowel is within normal. Appendix is not visualized. There is a redundant  sigmoid colon with wall thickening and adjacent inflammatory change  of the sigmoid colon with this wall thickening extending to the rectum with there is moderate distention fecal retention/impaction. Findings likely due to an acute colitis/proctitis. No evidence of obstruction or free peritoneal air. Remaining pelvic images demonstrate a left inguinal hernia containing a short segment of small bowel. The bladder and prostate are within normal. There is subacute fracture of the left inferior pubic ramus. There are degenerate changes of the spine and hips. IMPRESSION: Wall thickening with adjacent inflammatory change involving the rectum and sigmoid colon likely an acute colitis/proctitis. Moderate fecal retention/ impaction with distention of the rectum. Left inguinal hernia containing a short segment of small bowel. No incarceration or obstruction. Airspace opacification over the left lower lobe which may be due to a pneumonia. Linear scarring/atelectasis right lower lobe. 1.3 cm hypodensity over the mid to upper pole left kidney likely a slightly hyperdense cyst. Sub cm hypodensity over the lower pole left kidney too small to characterize but likely cyst. Subacute fracture left inferior pubic ramus. Electronically Signed   By: Elberta Fortisaniel  Boyle M.D.   On: 01/26/2016 14:29   Dg Chest Port 1 View  01/26/2016  CLINICAL DATA:  Patient with history of sepsis. Confusion and weakness. EXAM: PORTABLE CHEST 1 VIEW COMPARISON:  Chest radiograph 03/21/2007. FINDINGS: Multiple monitoring leads overlie the patient. Stable cardiac and mediastinal contours. Pulmonary hyperinflation. Possible nodular opacity right lung base. No consolidative pulmonary opacities. No pleural effusion or pneumothorax. Regional skeleton is unremarkable. IMPRESSION: No acute cardiopulmonary process. Possible nodular opacity right lung base. Recommend correlation with chest CT in the non acute setting to exclude the possibility of pulmonary nodule.  Electronically Signed   By: Annia Beltrew  Davis M.D.   On: 01/26/2016 12:26    EKG:   Orders placed or performed during the hospital encounter of 01/26/16  . EKG 12-Lead  . EKG 12-Lead    ASSESSMENT AND PLAN:   82 year old male with past medical history of advanced Alzheimer's dementia, hypertension, glaucoma, adult failure to thrive from [facility] admitted for sepsis   #1 Sepsis- likely from acute colitis - Blood cultures negative so far. Chest x-ray with no significant evidence of pneumonia. -Discontinue vancomycin and aztreonam. Started on Flagyl along with Levaquin. -Continue to monitor vitals, white blood cell count and fevers. -Lactic acid is pending today. -Continue IV fluids and antibiotics. Patient came in with multiorgan failure and severe sepsis. -On clear liquids today if he can tolerate  #2 metabolic encephalopathy-secondary to sepsis. Patient is alert and nodding to some questions. Probably close to his baseline. Has underlying advanced dementia.. -No CT of the head was done on admission.  #3 acute renal failure-likely ATN from underlying sepsis. Continue fluid resuscitation and monitor. No baseline creatinine available. Continue to monitor.  #4. Elevated troponin-likely demand ischemia secondary to sepsis.  #5 severe dementia-restart patient's Aricept, Zoloft, Namenda.  #6 hypertension-hold lisinopril due to renal failure. Continue to monitor blood pressure with sepsis. Blood pressure has improved today.  #7 DVT prophylaxis-on subcutaneous heparin   All the records are reviewed and case discussed with Care Management/Social Workerr. Management plans discussed with the patient, family and they are in agreement.  CODE STATUS: DNR  TOTAL TIME TAKING CARE OF THIS PATIENT: 37 minutes.   POSSIBLE D/C IN 2-3 DAYS, DEPENDING ON CLINICAL CONDITION.   Enid BaasKALISETTI,Cacey Willow M.D on 01/27/2016 at 10:39 AM  Between 7am to 6pm - Pager - [phone]  After 6pm go to  [email or web address] - password [email or web address]  Office  [phone]  CC: Primary care physician; Viviana Simpler, MD

## 2016-01-27 NOTE — Progress Notes (Signed)
Pt is being transfered to 1A.  Report given to Dava, RN.

## 2016-01-27 NOTE — Progress Notes (Addendum)
CRITICAL VALUE ALERT  Critical value received:  Lactic acid 2.5  Date of notification:  01/27/16  Time of notification:  11:25  Critical value read back:yes  Nurse who received alert: Krystel Fletchall,rn  MD notified (1st page):  Dr Nemiah Commanderkalisetti  Time of first page: 11:27  MD notified (2nd page):dr kalisetti. No med chg at 11:41  Time of second page:  Responding MD:  Dr Nemiah Commanderkalisetti  Time MD responded:

## 2016-01-27 NOTE — [place] FL2 (Signed)
[place] MEDICAID FL2 LEVEL OF CARE SCREENING TOOL     IDENTIFICATION  Patient Name: Glen Frazier Birthdate: [birth year] Sex: male Admission Date (Current Location): 01/26/2016  [place]   Facility and Address:  [facility], [address], [place], [place] [address]      Provider Number: [id number]  Attending Physician Name and Address:  Enid Baasadhika Kalisetti, MD  Relative Name and Phone Number:       Current Level of Care: Hospital Recommended Level of Care: Skilled Nursing Facility Prior Approval Number:    Date Approved/Denied:   PASRR Number:  ( [id number] )  Discharge Plan: SNF    Current Diagnoses: Patient Active Problem List   Diagnosis Date Noted  . Pressure ulcer 01/27/2016  . Sepsis (HCC) 01/26/2016  . ALZHEIMERS DISEASE 04/22/2010  . ABNORMAL HEART RHYTHMS 01/04/2009  . ANXIETY 01/25/2008  . CVA 01/25/2008  . HYPERLIPIDEMIA 11/26/2006  . GLAUCOMA 11/26/2006  . HYPERTENSION 11/26/2006  . ALLERGIC RHINITIS 11/26/2006  . VERTIGO 11/26/2006    Orientation RESPIRATION BLADDER Height & Weight     Self  Normal Incontinent Weight: 115 lb (52.164 kg) Height:  5\' 7"  (170.2 cm)  BEHAVIORAL SYMPTOMS/MOOD NEUROLOGICAL BOWEL NUTRITION STATUS   (none )  (none ) Incontinent Diet (Diet: Clear Liquid )  AMBULATORY STATUS COMMUNICATION OF NEEDS Skin   Extensive Assist Verbally PU Stage and Appropriate Care (Pressure Ulcer Stage 2: Right Ischium. )                       Personal Care Assistance Level of Assistance  Bathing, Feeding, Dressing Bathing Assistance: Limited assistance Feeding assistance: Limited assistance Dressing Assistance: Limited assistance     Functional Limitations Info  Sight, Hearing, Speech Sight Info: Adequate Hearing Info: Impaired Speech Info: Adequate    SPECIAL CARE FACTORS FREQUENCY  PT (By licensed PT), OT (By licensed OT)     PT Frequency:  (5) OT Frequency:   (5)            Contractures      Additional Factors Info  Code Status, Allergies Code Status Info:  (DNR ) Allergies Info:  (Paroxetine, Penicillins)           Current Medications (01/27/2016):  This is the current hospital active medication list Current Facility-Administered Medications  Medication Dose Route Frequency Provider Last Rate Last Dose  . 0.9 % NaCl with KCl 20 mEq/ L  infusion   Intravenous Continuous Houston SirenVivek J Sainani, MD 100 mL/hr at 01/27/16 0349    . acetaminophen (TYLENOL) tablet 650 mg  650 mg Oral Q6H PRN Houston SirenVivek J Sainani, MD       Or  . acetaminophen (TYLENOL) suppository 650 mg  650 mg Rectal Q6H PRN Houston SirenVivek J Sainani, MD      . acetaminophen (TYLENOL) tablet 650 mg  650 mg Oral BID Houston SirenVivek J Sainani, MD   650 mg at 01/27/16 1109  . aspirin chewable tablet 81 mg  81 mg Oral Daily Houston SirenVivek J Sainani, MD   81 mg at 01/27/16 1109  . donepezil (ARICEPT) tablet 10 mg  10 mg Oral Daily Houston SirenVivek J Sainani, MD   10 mg at 01/27/16 0857  . heparin injection 5,000 Units  5,000 Units Subcutaneous Q8H Houston SirenVivek J Sainani, MD   5,000 Units at 01/27/16 0552  . latanoprost (XALATAN) 0.005 % ophthalmic solution 1 drop  1 drop Both Eyes QPM Houston SirenVivek J Sainani, MD   1  drop at 01/26/16 2100  . [START ON 01/28/2016] levofloxacin (LEVAQUIN) IVPB 750 mg  750 mg Intravenous Q48H Houston Siren, MD      . levothyroxine (SYNTHROID, LEVOTHROID) tablet 25 mcg  25 mcg Oral QAC breakfast Houston Siren, MD   25 mcg at 01/27/16 0851  . meclizine (ANTIVERT) tablet 12.5 mg  12.5 mg Oral TID PRN Houston Siren, MD      . memantine [facility]) tablet 10 mg  10 mg Oral BID Houston Siren, MD   10 mg at 01/27/16 0856  . metroNIDAZOLE (FLAGYL) IVPB 500 mg  500 mg Intravenous Q8H Enid Baas, MD   500 mg at 01/27/16 1110  . nystatin-triamcinolone (MYCOLOG II) cream   Topical BID Enid Baas, MD      . ondansetron (ZOFRAN) tablet 4 mg  4 mg Oral Q6H PRN Houston Siren, MD       Or  . ondansetron  (ZOFRAN) injection 4 mg  4 mg Intravenous Q6H PRN Houston Siren, MD      . sertraline (ZOLOFT) tablet 150 mg  150 mg Oral Daily Houston Siren, MD   150 mg at 01/27/16 1109  . timolol (TIMOPTIC) 0.5 % ophthalmic solution 1 drop  1 drop Both Eyes Daily Houston Siren, MD   1 drop at 01/27/16 0901     Discharge Medications: Please see discharge summary for a list of discharge medications.  Relevant Imaging Results:  Relevant Lab Results:   Additional Information  (SSN: [id number])  Haig Prophet, LCSW

## 2016-01-28 DIAGNOSIS — A419 Sepsis, unspecified organism: Principal | ICD-10-CM

## 2016-01-28 DIAGNOSIS — N179 Acute kidney failure, unspecified: Secondary | ICD-10-CM

## 2016-01-28 DIAGNOSIS — I10 Essential (primary) hypertension: Secondary | ICD-10-CM

## 2016-01-28 DIAGNOSIS — K529 Noninfective gastroenteritis and colitis, unspecified: Secondary | ICD-10-CM

## 2016-01-28 DIAGNOSIS — G309 Alzheimer's disease, unspecified: Secondary | ICD-10-CM

## 2016-01-28 DIAGNOSIS — B3789 Other sites of candidiasis: Secondary | ICD-10-CM

## 2016-01-28 DIAGNOSIS — E875 Hyperkalemia: Secondary | ICD-10-CM

## 2016-01-28 DIAGNOSIS — E860 Dehydration: Secondary | ICD-10-CM

## 2016-01-28 DIAGNOSIS — J189 Pneumonia, unspecified organism: Secondary | ICD-10-CM

## 2016-01-28 DIAGNOSIS — Z515 Encounter for palliative care: Secondary | ICD-10-CM

## 2016-01-28 DIAGNOSIS — E785 Hyperlipidemia, unspecified: Secondary | ICD-10-CM

## 2016-01-28 DIAGNOSIS — F0280 Dementia in other diseases classified elsewhere without behavioral disturbance: Secondary | ICD-10-CM

## 2016-01-28 DIAGNOSIS — E870 Hyperosmolality and hypernatremia: Secondary | ICD-10-CM

## 2016-01-28 MED ORDER — LOPERAMIDE HCL 1 MG/5ML PO LIQD: 1.0000 mg | Freq: Four times a day (QID) | ORAL | Status: AC | PRN

## 2016-01-28 MED ORDER — MORPHINE SULFATE (CONCENTRATE) 10 MG/0.5ML PO SOLN: 5.0000 mg | ORAL | Status: DC | PRN

## 2016-01-28 MED ORDER — NYSTATIN-TRIAMCINOLONE 100000-0.1 UNIT/GM-% EX CREA: TOPICAL_CREAM | Freq: Two times a day (BID) | CUTANEOUS | Status: AC

## 2016-01-28 MED ORDER — LORAZEPAM 0.5 MG PO TABS: 0.5000 mg | ORAL_TABLET | ORAL | Status: AC | PRN

## 2016-01-28 MED ORDER — ACETAMINOPHEN 650 MG RE SUPP: 650.0000 mg | Freq: Four times a day (QID) | RECTAL | Status: AC | PRN

## 2016-01-28 MED ORDER — POLYVINYL ALCOHOL 1.4 % OP SOLN: 1.0000 [drp] | Freq: Three times a day (TID) | OPHTHALMIC | Status: AC

## 2016-01-28 MED ORDER — POLYVINYL ALCOHOL 1.4 % OP SOLN
1.0000 [drp] | Freq: Three times a day (TID) | OPHTHALMIC | Status: DC
  Filled 2016-01-28: qty 15

## 2016-01-28 MED ORDER — LORAZEPAM 0.5 MG PO TABS: 0.5000 mg | ORAL_TABLET | ORAL | Status: DC | PRN

## 2016-01-28 MED ORDER — BOOST / RESOURCE BREEZE PO LIQD: 1.0000 | Freq: Three times a day (TID) | ORAL | Status: DC

## 2016-01-28 MED ORDER — BISACODYL 10 MG RE SUPP: 10.0000 mg | Freq: Every day | RECTAL | Status: AC | PRN

## 2016-01-28 MED ORDER — PROCHLORPERAZINE 25 MG RE SUPP
25.0000 mg | Freq: Three times a day (TID) | RECTAL | Status: DC | PRN
  Filled 2016-01-28: qty 1

## 2016-01-28 MED ORDER — LOPERAMIDE HCL 1 MG/5ML PO LIQD
1.0000 mg | Freq: Four times a day (QID) | ORAL | Status: DC | PRN
  Filled 2016-01-28: qty 5

## 2016-01-28 MED ORDER — MORPHINE SULFATE (CONCENTRATE) 10 MG/0.5ML PO SOLN: 5.0000 mg | ORAL | Status: AC | PRN

## 2016-01-28 MED ORDER — PROCHLORPERAZINE 25 MG RE SUPP: 25.0000 mg | Freq: Three times a day (TID) | RECTAL | Status: AC | PRN

## 2016-01-28 MED ORDER — BISACODYL 10 MG RE SUPP: 10.0000 mg | Freq: Every day | RECTAL | Status: DC | PRN

## 2016-01-28 NOTE — Discharge Summary (Signed)
[facility] [place] at [facility]   PATIENT NAME: Glen Frazier    MR#:  [id number]  DATE OF BIRTH:  Oct 04, 1927  DATE OF ADMISSION:  01/26/2016 ADMITTING PHYSICIAN: Houston Siren, MD  DATE OF DISCHARGE: 01/28/2016  PRIMARY CARE PHYSICIAN: Tillman Abide, MD    ADMISSION DIAGNOSIS:  Dehydration [E86.0] Hypokalemia [E87.6] Malnutrition (HCC) [E46] Acute colitis [K52.9] Elevated troponin I level [R79.89] Elevated lactic acid level [E87.2] Pubic ramus fracture, left, closed, initial encounter (HCC) [S32.592A] Sepsis, due to unspecified organism (HCC) [A41.9] Acute renal failure, unspecified acute renal failure type (HCC) [N17.9] Non-intractable vomiting with nausea, vomiting of unspecified type [R11.2] Gastrointestinal hemorrhage, unspecified gastritis, unspecified gastrointestinal hemorrhage type [K92.2] Alzheimer's disease of other onset [G30.8, F02.80]  DISCHARGE DIAGNOSIS:  Active Problems:   Sepsis (HCC)   Pressure ulcer   Dehydration   SECONDARY DIAGNOSIS:   Past Medical History  Diagnosis Date  . Allergy   . Hyperlipidemia   . Hypertension   . Vertigo   . Glaucoma   . Chronic back pain   . Anxiety   . Alzheimer's dementia   . Pilonidal cyst   . Brainstem infarct, acute [facility])     HOSPITAL COURSE:   82 year old male with past medical history of advanced Alzheimer's dementia, hypertension, glaucoma, adult failure to thrive from [facility] admitted for sepsis   #1 severe Sepsis- secondary to acute colitis - Blood cultures negative so far. Chest x-ray with no significant evidence of pneumonia. Pneumonia is ruled out -Patient was on on Flagyl and Levaquin. Discontinued at discharge as he will be going on comfort care Received fluids in the hospital. However refusing to eat diet.  #2 metabolic encephalopathy-secondary to sepsis. Patient is alert and nodding to some questions. Probably close to his baseline. Has underlying  advanced dementia.. -No CT of the head was done on admission.  #3 acute renal failure-likely ATN from underlying sepsis.   #4. Elevated troponin-likely demand ischemia secondary to sepsis.  #5 severe dementia-meds have been discontinued at discharge  #6 hypertension-low blood pressure due to sepsis.  Overall poor prognosis. Poor quality of life to begin with according to daughter. Palliative care consulted. Patient's family wanted him to go to hospice home. No bedside hospice home. Patient will be going back to [facility] [place] with hospice following. He is only on comfort care.  DISCHARGE CONDITIONS:   Critical  CONSULTS OBTAINED:   Palliative care consultation by Dr. Orvan Falconer  DRUG ALLERGIES:   Allergies  Allergen Reactions  . Paroxetine     REACTION: syncope, shakes  . Penicillins Other (See Comments)    REACTION: not specified on MAR.    DISCHARGE MEDICATIONS:   Current Discharge Medication List    START taking these medications   Details  acetaminophen (TYLENOL) 650 MG suppository Place 1 suppository (650 mg total) rectally every 6 (six) hours as needed for mild pain (or Fever >/= 101). Qty: 6 suppository, Refills: 0    bisacodyl (DULCOLAX) 10 MG suppository Place 1 suppository (10 mg total) rectally daily as needed for moderate constipation. Qty: 6 suppository, Refills: 0    loperamide (IMODIUM) 1 MG/5ML solution Take 5 mLs (1 mg total) by mouth every 6 (six) hours as needed (watery diarrhea (pt is negative for C diff toxin but is having some diarrhea)). Qty: 118 mL, Refills: 0    LORazepam (ATIVAN) 0.5 MG tablet Take 1 tablet (0.5 mg total) by mouth every 4 (four) hours as needed for anxiety. Qty: 16  tablet, Refills: 0    Morphine Sulfate (MORPHINE CONCENTRATE) 10 MG/0.5ML SOLN concentrated solution Place 0.25 mLs (5 mg total) under the tongue every 4 (four) hours as needed for moderate pain, severe pain or shortness of breath. Qty: 30 mL, Refills: 0     nystatin-triamcinolone (MYCOLOG II) cream Apply topically 2 (two) times daily. To scrotum and groin area. Qty: 30 g, Refills: 0    polyvinyl alcohol (LIQUIFILM TEARS) 1.4 % ophthalmic solution Place 1 drop into both eyes every 8 (eight) hours. Qty: 15 mL, Refills: 0    prochlorperazine (COMPAZINE) 25 MG suppository Place 1 suppository (25 mg total) rectally every 8 (eight) hours as needed for nausea or vomiting. Qty: 6 suppository, Refills: 0      STOP taking these medications     acetaminophen (TYLENOL) 650 MG CR tablet      aspirin 81 MG chewable tablet      donepezil (ARICEPT) 10 MG tablet      latanoprost (XALATAN) 0.005 % ophthalmic solution      levothyroxine (SYNTHROID, LEVOTHROID) 25 MCG tablet      lisinopril (PRINIVIL,ZESTRIL) 10 MG tablet      meclizine (ANTIVERT) 12.5 MG tablet      memantine (NAMENDA) 10 MG tablet      sertraline (ZOLOFT) 50 MG tablet      timolol (TIMOPTIC) 0.5 % ophthalmic solution          DISCHARGE INSTRUCTIONS:   #1. [facility] comfort care 2. PCP f/u as needed  If you experience worsening of your admission symptoms, develop shortness of breath, life threatening emergency, suicidal or homicidal thoughts you must seek medical attention immediately by calling 911 or calling your MD immediately  if symptoms less severe.  You Must read complete instructions/literature along with all the possible adverse reactions/side effects for all the Medicines you take and that have been prescribed to you. Take any new Medicines after you have completely understood and accept all the possible adverse reactions/side effects.   Please note  You were cared for by a hospitalist during your hospital stay. If you have any questions about your discharge medications or the care you received while you were in the hospital after you are discharged, you can call the unit and asked to speak with the hospitalist on call if the  hospitalist that took care of you is not available. Once you are discharged, your primary care physician will handle any further medical issues. Please note that NO REFILLS for any discharge medications will be authorized once you are discharged, as it is imperative that you return to your primary care physician (or establish a relationship with a primary care physician if you do not have one) for your aftercare needs so that they can reassess your need for medications and monitor your lab values.    Today   CHIEF COMPLAINT:   Chief Complaint  Patient presents with  . Weakness    VITAL SIGNS:  Blood pressure 132/57, pulse 56, temperature 97.7 F (36.5 C), temperature source Axillary, resp. rate 20, height 5\' 7"  (1.702 m), weight 52.164 kg (115 lb), SpO2 94 %.  I/O:   Intake/Output Summary (Last 24 hours) at 01/28/16 1155 Last data filed at 01/27/16 1830  Gross per 24 hour  Intake 1138.33 ml  Output      0 ml  Net 1138.33 ml    PHYSICAL EXAMINATION:   Physical Exam  GENERAL: 82 y.o.-year-old patient lying in the  bed with no acute distress.  EYES: Pupils equal, round, reactive to light and accommodation. No scleral icterus. Extraocular muscles intact.  HEENT: Head atraumatic, normocephalic. Oropharynx and nasopharynx clear.  NECK: Supple, no jugular venous distention. No thyroid enlargement, no tenderness.  LUNGS: Normal breath sounds bilaterally, no wheezing, rales,rhonchi or crepitation. No use of accessory muscles of respiration. Decreased bibasilar breath sounds CARDIOVASCULAR: S1, S2 normal. No murmurs, rubs, or gallops.  ABDOMEN: Soft, tender left lower quadrant with voluntary guarding, nondistended. Bowel sounds present. No organomegaly or mass.  EXTREMITIES: No pedal edema, cyanosis, or clubbing.  Erythematous, tender scrotum and penis tip NEUROLOGIC: Alert, nodding head to questions occasionally, minimally verbal,occasionally cussing out to leave him alone,  global weakness Unable to assess otherwise neurologically due to severe dementia PSYCHIATRIC: The patient is alert but not oriented, dementia at baseline  SKIN: erythematous scrotum   DATA REVIEW:   CBC  Recent Labs Lab 01/27/16 0424  WBC 7.3  HGB 12.3*  HCT 37.0*  PLT 231    Chemistries   Recent Labs Lab 01/26/16 1224 01/27/16 0424  NA 147* 143  K 3.1* 4.0  CL 109 113*  CO2 18* 18*  GLUCOSE 210* 83  BUN 44* 58*  CREATININE 1.79* 1.84*  CALCIUM 8.8* 7.3*  AST 45*  --   ALT 14*  --   ALKPHOS 91  --   BILITOT 1.0  --     Cardiac Enzymes  Recent Labs Lab 01/26/16 1224  TROPONINI 0.05*    Microbiology Results  Results for orders placed or performed during the hospital encounter of 01/26/16  Urine culture     Status: Abnormal   Collection Time: 01/26/16 12:23 PM  Result Value Ref Range Status   Specimen Description URINE, CATHETERIZED  Final   Special Requests NONE  Final   Culture (A)  Final    <10,000 COLONIES/mL INSIGNIFICANT GROWTH Performed at [facility]    Report Status 01/27/2016 FINAL  Final  Culture, blood (routine x 2)     Status: None (Preliminary result)   Collection Time: 01/26/16 12:23 PM  Result Value Ref Range Status   Specimen Description BLOOD RIGHT ASSIST CONTROL  Final   Special Requests BOTTLES DRAWN AEROBIC AND ANAEROBIC 2CCAERO,2CCANA  Final   Culture NO GROWTH < 24 HOURS  Final   Report Status PENDING  Incomplete  Culture, blood (routine x 2)     Status: None (Preliminary result)   Collection Time: 01/26/16 12:24 PM  Result Value Ref Range Status   Specimen Description BLOOD LEFT ASSIST CONTROL  Final   Special Requests BOTTLES DRAWN AEROBIC AND ANAEROBIC 2CCAERO,3CCANA  Final   Culture NO GROWTH < 24 HOURS  Final   Report Status PENDING  Incomplete  Gastrointestinal Panel by PCR , Stool     Status: None   Collection Time: 01/26/16  4:39 PM  Result Value Ref Range Status   Campylobacter species NOT DETECTED NOT  DETECTED Final   Plesimonas shigelloides NOT DETECTED NOT DETECTED Final   Salmonella species NOT DETECTED NOT DETECTED Final   Yersinia enterocolitica NOT DETECTED NOT DETECTED Final   Vibrio species NOT DETECTED NOT DETECTED Final   Vibrio cholerae NOT DETECTED NOT DETECTED Final   Enteroaggregative E coli (EAEC) NOT DETECTED NOT DETECTED Final   Enteropathogenic E coli (EPEC) NOT DETECTED NOT DETECTED Final   Enterotoxigenic E coli (ETEC) NOT DETECTED NOT DETECTED Final   Shiga like toxin producing E coli (STEC) NOT DETECTED NOT DETECTED Final  E. coli O157 NOT DETECTED NOT DETECTED Final   Shigella/Enteroinvasive E coli (EIEC) NOT DETECTED NOT DETECTED Final   Cryptosporidium NOT DETECTED NOT DETECTED Final   Cyclospora cayetanensis NOT DETECTED NOT DETECTED Final   Entamoeba histolytica NOT DETECTED NOT DETECTED Final   Giardia lamblia NOT DETECTED NOT DETECTED Final   Adenovirus F40/41 NOT DETECTED NOT DETECTED Final   Astrovirus NOT DETECTED NOT DETECTED Final   Norovirus GI/GII NOT DETECTED NOT DETECTED Final   Rotavirus A NOT DETECTED NOT DETECTED Final   Sapovirus (I, II, IV, and V) NOT DETECTED NOT DETECTED Final  C difficile quick scan w PCR reflex     Status: None   Collection Time: 01/26/16  4:39 PM  Result Value Ref Range Status   C Diff antigen NEGATIVE NEGATIVE Final   C Diff toxin NEGATIVE NEGATIVE Final   C Diff interpretation Negative for C. difficile  Final  Surgical pcr screen     Status: None   Collection Time: 01/26/16  4:42 PM  Result Value Ref Range Status   MRSA, PCR NEGATIVE NEGATIVE Final   Staphylococcus aureus NEGATIVE NEGATIVE Final    Comment:        The Xpert SA Assay (FDA approved for NASAL specimens in patients over 3 years of age), is one component of a comprehensive surveillance program.  Test performance has been validated by [facility] for patients greater than or equal to 45 year old. It is not intended to diagnose infection nor  to guide or monitor treatment.     RADIOLOGY:  Ct Abdomen Pelvis Wo Contrast  01/26/2016  CLINICAL DATA:  Increased confusion and weakness. Found in bed this morning covered involvement and feces. Dementia. EXAM: CT ABDOMEN AND PELVIS WITHOUT CONTRAST TECHNIQUE: Multidetector CT imaging of the abdomen and pelvis was performed following the standard protocol without IV contrast. COMPARISON:  None. FINDINGS: Lung bases demonstrate airspace opacification over the posterior left lower lobe which may be due to pneumonia versus atelectasis. Mild linear atelectasis/ scarring over the posterior right lower lobe. Calcified plaque over the right coronary artery. Abdominal images demonstrate the liver, spleen, pancreas, gallbladder and adrenal glands to be within normal. Kidneys are normal in size without hydronephrosis or nephrolithiasis. Exophytic 1.3 cm hypodensity over the upper pole left kidney with Hounsfield unit measurements 19 likely slightly hyperdense cyst. Sub cm hypodensity over the lower pole left renal cortex too small to characterize but likely a cyst. Ureters are within normal. There is moderate calcified plaque over the abdominal aorta and iliac arteries. Stomach is within normal. Small bowel is within normal. Appendix is not visualized. There is a redundant sigmoid colon with wall thickening and adjacent inflammatory change of the sigmoid colon with this wall thickening extending to the rectum with there is moderate distention fecal retention/impaction. Findings likely due to an acute colitis/proctitis. No evidence of obstruction or free peritoneal air. Remaining pelvic images demonstrate a left inguinal hernia containing a short segment of small bowel. The bladder and prostate are within normal. There is subacute fracture of the left inferior pubic ramus. There are degenerate changes of the spine and hips. IMPRESSION: Wall thickening with adjacent inflammatory change involving the rectum and sigmoid  colon likely an acute colitis/proctitis. Moderate fecal retention/ impaction with distention of the rectum. Left inguinal hernia containing a short segment of small bowel. No incarceration or obstruction. Airspace opacification over the left lower lobe which may be due to a pneumonia. Linear scarring/atelectasis right lower lobe. 1.3 cm hypodensity  over the mid to upper pole left kidney likely a slightly hyperdense cyst. Sub cm hypodensity over the lower pole left kidney too small to characterize but likely cyst. Subacute fracture left inferior pubic ramus. Electronically Signed   By: Elberta Fortis M.D.   On: 01/26/2016 14:29   Dg Chest Port 1 View  01/26/2016  CLINICAL DATA:  Patient with history of sepsis. Confusion and weakness. EXAM: PORTABLE CHEST 1 VIEW COMPARISON:  Chest radiograph 03/21/2007. FINDINGS: Multiple monitoring leads overlie the patient. Stable cardiac and mediastinal contours. Pulmonary hyperinflation. Possible nodular opacity right lung base. No consolidative pulmonary opacities. No pleural effusion or pneumothorax. Regional skeleton is unremarkable. IMPRESSION: No acute cardiopulmonary process. Possible nodular opacity right lung base. Recommend correlation with chest CT in the non acute setting to exclude the possibility of pulmonary nodule. Electronically Signed   By: Annia Belt M.D.   On: 01/26/2016 12:26    EKG:   Orders placed or performed during the hospital encounter of 01/26/16  . EKG 12-Lead  . EKG 12-Lead      Management plans discussed with the patient, family and they are in agreement.  CODE STATUS: DNR    Code Status Orders        Start     Ordered   01/26/16 1625  Do not attempt resuscitation (DNR)   Continuous    Question Answer Comment  In the event of cardiac or respiratory ARREST Do not call a "code blue"   In the event of cardiac or respiratory ARREST Do not perform Intubation, CPR, defibrillation or ACLS   In the event of cardiac or respiratory  ARREST Use medication by any route, position, wound care, and other measures to relive pain and suffering. May use oxygen, suction and manual treatment of airway obstruction as needed for comfort.      01/26/16 1625    Code Status History    Date Active Date Inactive Code Status Order ID Comments User Context   01/26/2016  2:44 PM 01/26/2016  4:25 PM DNR [id number]  Loleta Rose, MD ED    Advance Directive Documentation        Most Recent Value   Type of Advance Directive  Out of facility DNR (pink MOST or yellow form), Healthcare Power of Attorney   Pre-existing out of facility DNR order (yellow form or pink MOST form)  Yellow form placed in chart (order not valid for inpatient use)   "MOST" Form in Place?        TOTAL TIME TAKING CARE OF THIS PATIENT: 37 minutes.    Enid Baas M.D on 01/28/2016 at 11:55 AM  Between 7am to 6pm - Pager - [phone]  After 6pm go to [email or web address] - password [email or web address] [facility]  [place] [facility]  Office  [phone]  CC: Primary care physician; Tillman Abide, MD

## 2016-01-28 NOTE — Progress Notes (Signed)
Pt refused morning lab draw.

## 2016-01-28 NOTE — Progress Notes (Signed)
Initial Nutrition Assessment     INTERVENTION:  -Monitor intake and diet progression -Recommend boost breeze TID for added nutrition while on clear liquid diet.    NUTRITION DIAGNOSIS:   Inadequate oral intake related to acute illness as evidenced by meal completion < 25%.    GOAL:   Patient will meet greater than or equal to 90% of their needs    MONITOR:   PO intake, Diet advancement, Supplement acceptance  REASON FOR ASSESSMENT:   Malnutrition Screening Tool    ASSESSMENT:      Pt admitted with sepsis, acute colitis, weakness.  Noted c-diff negative  Past Medical History  Diagnosis Date  . Allergy   . Hyperlipidemia   . Hypertension   . Vertigo   . Glaucoma   . Chronic back pain   . Anxiety   . Alzheimer's dementia   . Pilonidal cyst   . Brainstem infarct, acute (HCC)    Pt sleeping during visit this am.  No family at bedside.  Pt unable to answer questions regarding intake and wt loss secondary to dementia. No liquids eaten this am, full tray at bedside Noted per I and O sheet intake 0% of meals  Medications reviewed; nystatin, NS with KCL at 15400ml/hr  Labs reviewed: BUN 58, creatinine 1.84   Unable to complete Nutrition-Focused physical exam at this time.    Diet Order:  Diet clear liquid Room service appropriate?: Yes; Fluid consistency:: Thin  Skin:   (pressure ulcer stage II noted unsure location)  Last BM:  6/19  Height:   Ht Readings from Last 1 Encounters:  01/26/16 5\' 7"  (1.702 m)    Weight: Noted per wt encounters 29% wt loss in the last 4 months  Wt Readings from Last 1 Encounters:  01/26/16 115 lb (52.164 kg)   Wt Readings from Last 10 Encounters:  01/26/16  115 lb (52.164 kg)  09/22/11  162 lb (73.483 kg)  04/25/11  155 lb (70.308 kg)  02/03/11  151 lb (68.493 kg)  10/01/10  157 lb (71.215 kg)  07/01/10  156 lb (70.761 kg)  06/04/10  162 lb 12 oz (73.823 kg)  05/27/10  165 lb (74.844 kg)  04/22/10  169 lb  (76.658 kg)  03/06/10  170 lb 6.1 oz (77.284 kg)  Ideal Body Weight:     BMI:  Body mass index is 18.01 kg/(m^2).  Estimated Nutritional Needs:   Kcal:  1560-1820 kcals/d  Protein:  78-104 g/d  Fluid:  1.5-1.8 L/d  EDUCATION NEEDS:   No education needs identified at this time  Leeman Johnsey B. Freida BusmanAllen, RD, LDN [phone] (pager) Weekend/On-Call pager [phone])

## 2016-01-28 NOTE — Discharge Instructions (Signed)
HOSPICE AT [place]

## 2016-01-28 NOTE — Progress Notes (Signed)
Clinical Social Worker (CSW) discussed case with attending MD who ordered a palliative care consult. CSW contacted palliative care MD and made her aware that patient's daughter is interested in hospice services. Per Sue LushAndrea admissions coordinator at [facility] is a long term care SNF resident and can [facility] if needed. CSW will continue to follow and assist as needed.   Jetta LoutBailey Morgan, LCSW [phone]

## 2016-01-28 NOTE — Progress Notes (Signed)
Patient is being discharged back [facility] via [facility]. IV removed by Selena BattenKim, RN.  Report called and waiting for EMS to Arrive at this time. Patient made aware of transport.

## 2016-01-28 NOTE — Progress Notes (Addendum)
New referral for [facility] services at [facility] following discharge received from CSW [employer] Morgan. Mr. Glen Frazier was [facility] for evaluation and treatment weakness, he was found to be septic with a lactate level of 9.6, hypernatremic, hyperkalemic, in acute renal failure and a nonspecific colitis was seen on CT. His family requested no aggressive interventions and he was started on IV fluids and IV antibiotics. He continues with poor appetite. Dr. Orvan Falconerampbell, Palliative Care physician has spoken with his daughter and the plan is for discharge back to [facility] with a referral for hospice services. Patient seen lying in bed, alert, staff CNA present to do personal care. Patient remains resistant to care, responds with yes or no to questions. Foam dressings noted to bilateral ankles, sacrum and forearms. Last bowel movement today.  Writer spoke on the phone with patient's daughter Glen Frazier [phone]) to initiate education regarding hospice services, philosophy and team approach to care with basic understanding voiced. Patient to discharge via EMS today with portable DNR in place. All information faxed to [facility] referral. Hospital care team made aware. Thank you for the opportunity to be involved in the care of this patient and his family. Dayna BarkerKaren Robertson RN, BSN, [facility], hospital Liaison [phone] c

## 2016-01-28 NOTE — Progress Notes (Signed)
Clinical Child psychotherapistocial Worker (CSW) received consult for hospice screen. CSW contacted patient's daughter Eunice BlaseDebbie and offered hospice agency choice. Daughter chose [facility]. [facility] liaison was notified. Patient is stable for D[facility] today. Per Sue LushAndrea admissions coordinator [facility] will go to room 315. RN will call report at [phone] and arranged EMS for transport. CSW sent D/C Summary, FL2 and D/C Packet to [employer] via [facility]. CSW contacted patient's daughter Eunice BlaseDebbie and left her a voicemail making her aware that discharge paper work is ready. CSW also contacted patient's son Virl DiamondChuck and made him aware of above. Please reconsult if future social work needs arise. CSW signing off.   Jetta LoutBailey Morgan, LCSW [phone]

## 2016-01-28 NOTE — Consult Note (Signed)
Palliative Medicine Inpatient Consult Note   Name: Glen Frazier Date: 01/28/2016 MRN: [id number]  DOB: August 16, 1927  Referring Physician: Enid Baasadhika Kalisetti, MD  Palliative Care consult requested for this 82 y.o. male for goals of medical therapy in patient with  Sepsis.    DISCUSSIONS AND PLANS: I was made aware this morning that daughter had expressed an interest in arranging for Hospice type care for pt --if appropriate.  He is a long term care [facility] and could return there with Hospice, but daughter had expressed interest in Hospice Home.  He presented to the ED on 6/17 with decreased awareness and was found to have a lactate of 9.6, hyperkalemia, hypernatremia, acute renal failure and a nonspecific colitis seen on CT. Additionally, he has a possible left lower love pneumonia and some diarrhea is noted.   Family did not want any aggressive interventions such as ICU care with pressors, central line, etc.  He has been getting IV fluids and antibiotics on the medical floor.  He has the additional problems as detailed below in the past history list.    Since admission, pt has not eaten much if anything.  He refused today's morning blood draws.  The C Diff labs were negative. He is currently getting IV Levaquin and Flagyl.  He will open his eyes and nod 'no' but won't accept any food or meds orally and is resistant to care.    I have called daughter and learned that pt has lived [facility] about [facility]. In the past couple of months he has gone downhill in terms of only eating a couple of bites and two sips of cola at that most --and not doing that at every meal. He is now bedbound/ wheelchair bound, whereas he used to walk the halls. He refuses most of his meds there.   He doesn't seem to be in any pain. He is not short of breath. He has a red-raw scrotum from yeast infection.    Daughter agrees with pt going back to [facility] with terminal comfort care under [facility].  Pt does not have serious symptoms at this time, and I explained that w/o symptoms, he could be charged a fee daily  --and that won't occur at [facility]. Also, there is no Hospice Home bed available today (given others ahead of him on the list to go there).  Daughter agrees with total comfort care here and at [facility]. Will initiate orders. I provided info on this and also other supportive advice.   See orders.  Social Work notified about daughter's request for Hospice at [facility].      PAST MEDICAL HISTORY: Past Medical History  Diagnosis Date  . Allergy   . Hyperlipidemia   . Hypertension   . Vertigo   . Glaucoma   . Chronic back pain   . Anxiety   . Alzheimer's dementia   . Pilonidal cyst   . Brainstem infarct, acute (HCC)     PAST SURGICAL HISTORY:  Past Surgical History  Procedure Laterality Date  . Cervical disc surgery      REVIEW OF SYSTEMS:  Patient is not able to provide ROS due to illness. There are no recent or current records documenting his pre-illness functional status.  In 2013, he had an office visit and it was noted that he still dressed himself but was occasionally incontinent.  There are notes referencing [facility], but  I cannot tell from the record how advanced his dementia is at this time.   SPIRITUAL SUPPORT SYSTEM: Yes.  SOCIAL HISTORY:  reports that he has quit smoking. He has never used smokeless tobacco. He reports that he does not drink alcohol or use illicit drugs.LIVES AT [facility] AS A LONG TERM RESIDENT.   Daughter, Lolita Rieger, is purportedly 'poa' --I do not see any paperwork in the chart, however.  Son, Lillard Bailon, is also listed as a contact.  Insurance is Medicare (not managed) and BCBS supplement.   LEGAL DOCUMENTS:  DRN form is in the paper chart.  CODE STATUS: DNR  ALLERGIES:  is allergic to paroxetine and penicillins.  MEDICATIONS:  Current Facility-Administered Medications   Medication Dose Route Frequency Provider Last Rate Last Dose  . 0.9 % NaCl with KCl 20 mEq/ L  infusion   Intravenous Continuous Houston Siren, MD 100 mL/hr at 01/27/16 1717    . acetaminophen (TYLENOL) tablet 650 mg  650 mg Oral Q6H PRN Houston Siren, MD       Or  . acetaminophen (TYLENOL) suppository 650 mg  650 mg Rectal Q6H PRN Houston Siren, MD      . acetaminophen (TYLENOL) tablet 650 mg  650 mg Oral BID Houston Siren, MD   650 mg at 01/27/16 1109  . aspirin chewable tablet 81 mg  81 mg Oral Daily Houston Siren, MD   81 mg at 01/27/16 1109  . donepezil (ARICEPT) tablet 10 mg  10 mg Oral Daily Houston Siren, MD   10 mg at 01/27/16 0857  . heparin injection 5,000 Units  5,000 Units Subcutaneous Q8H Houston Siren, MD   5,000 Units at 01/27/16 1654  . latanoprost (XALATAN) 0.005 % ophthalmic solution 1 drop  1 drop Both Eyes QPM Houston Siren, MD   1 drop at 01/26/16 2100  . levofloxacin (LEVAQUIN) IVPB 750 mg  750 mg Intravenous Q48H Houston Siren, MD      . levothyroxine (SYNTHROID, LEVOTHROID) tablet 25 mcg  25 mcg Oral QAC breakfast Houston Siren, MD   25 mcg at 01/27/16 0851  . meclizine (ANTIVERT) tablet 12.5 mg  12.5 mg Oral TID PRN Houston Siren, MD      . memantine [facility]) tablet 10 mg  10 mg Oral BID Houston Siren, MD   10 mg at 01/27/16 0856  . metroNIDAZOLE (FLAGYL) IVPB 500 mg  500 mg Intravenous Q8H Enid Baas, MD   500 mg at 01/28/16 0232  . nystatin-triamcinolone (MYCOLOG II) cream   Topical BID Enid Baas, MD      . ondansetron (ZOFRAN) tablet 4 mg  4 mg Oral Q6H PRN Houston Siren, MD       Or  . ondansetron (ZOFRAN) injection 4 mg  4 mg Intravenous Q6H PRN Houston Siren, MD      . sertraline (ZOLOFT) tablet 150 mg  150 mg Oral Daily Houston Siren, MD   150 mg at 01/27/16 1109  . timolol (TIMOPTIC) 0.5 % ophthalmic solution 1 drop  1 drop Both Eyes Daily Houston Siren, MD   1 drop at 01/27/16 0901    Vital Signs: BP 132/57  mmHg  Pulse 56  Temp(Src) 97.7 F (36.5 C) (Axillary)  Resp 20  Ht  (1.702 m)  Wt 52.164 kg (115 lb)  BMI 18.01 kg/m2  SpO2 94% Filed Weights   01/26/16 1138 01/26/16 1655  Weight: 68.04  kg (150 lb) 52.164 kg (115 lb)    Estimated body mass index is 18.01 kg/(m^2) as calculated from the following:   Height as of this encounter: 5\' 7"  (1.702 m).   Weight as of this encounter: 52.164 kg (115 lb).  PERFORMANCE STATUS (ECOG) : 4 - Bedbound  PHYSICAL EXAM: Lying in bed with loud but even respirations Eyes closed but he opens them and nods 'no' over and over Mouth opens with some prodding on my part (verbal and physical prodding) ---I could only see the anterior tongue and do not see thrush there Neck w/o JVD or TM Hrt rrr no m Lungs rales left base Abd soft nt Ext no cyanosis or mottling  LABS: CBC:    Component Value Date/Time   WBC 7.3 01/27/2016 0424   HGB 12.3* 01/27/2016 0424   HCT 37.0* 01/27/2016 0424   PLT 231 01/27/2016 0424   MCV 89.7 01/27/2016 0424   NEUTROABS 11.8* 01/26/2016 1224   LYMPHSABS 0.6* 01/26/2016 1224   MONOABS 0.6 01/26/2016 1224   EOSABS 0.0 01/26/2016 1224   BASOSABS 0.0 01/26/2016 1224   Comprehensive Metabolic Panel:    Component Value Date/Time   NA 143 01/27/2016 0424   K 4.0 01/27/2016 0424   CL 113* 01/27/2016 0424   CO2 18* 01/27/2016 0424   BUN 58* 01/27/2016 0424   CREATININE 1.84* 01/27/2016 0424   GLUCOSE 83 01/27/2016 0424   CALCIUM 7.3* 01/27/2016 0424   AST 45* 01/26/2016 1224   ALT 14* 01/26/2016 1224   ALKPHOS 91 01/26/2016 1224   BILITOT 1.0 01/26/2016 1224   PROT 7.1 01/26/2016 1224   ALBUMIN 3.8 01/26/2016 1224     More than 50% of the visit was spent in counseling/coordination of care: Yes  Time Spent: 80 minutes

## 2016-01-29 DIAGNOSIS — E43 Unspecified severe protein-calorie malnutrition: Secondary | ICD-10-CM | POA: Diagnosis not present

## 2016-01-29 DIAGNOSIS — G301 Alzheimer's disease with late onset: Secondary | ICD-10-CM

## 2016-01-29 DIAGNOSIS — F22 Delusional disorders: Secondary | ICD-10-CM | POA: Diagnosis not present

## 2016-01-29 DIAGNOSIS — K559 Vascular disorder of intestine, unspecified: Secondary | ICD-10-CM

## 2016-01-31 LAB — CULTURE, BLOOD (ROUTINE X 2)
CULTURE: NO GROWTH
CULTURE: NO GROWTH

## 2016-02-18 DIAGNOSIS — A419 Sepsis, unspecified organism: Secondary | ICD-10-CM

## 2016-02-18 DIAGNOSIS — N179 Acute kidney failure, unspecified: Secondary | ICD-10-CM | POA: Diagnosis not present

## 2016-02-18 DIAGNOSIS — J189 Pneumonia, unspecified organism: Secondary | ICD-10-CM

## 2016-02-18 DIAGNOSIS — G9341 Metabolic encephalopathy: Secondary | ICD-10-CM

## 2016-03-12 DIAGNOSIS — I69398 Other sequelae of cerebral infarction: Secondary | ICD-10-CM | POA: Diagnosis not present

## 2016-03-12 DIAGNOSIS — I10 Essential (primary) hypertension: Secondary | ICD-10-CM | POA: Diagnosis not present

## 2016-03-12 DIAGNOSIS — G309 Alzheimer's disease, unspecified: Secondary | ICD-10-CM | POA: Diagnosis not present

## 2016-04-28 DIAGNOSIS — J189 Pneumonia, unspecified organism: Secondary | ICD-10-CM

## 2016-04-28 DIAGNOSIS — G9341 Metabolic encephalopathy: Secondary | ICD-10-CM | POA: Diagnosis not present

## 2016-04-28 DIAGNOSIS — F0280 Dementia in other diseases classified elsewhere without behavioral disturbance: Secondary | ICD-10-CM

## 2016-04-28 DIAGNOSIS — G309 Alzheimer's disease, unspecified: Secondary | ICD-10-CM | POA: Diagnosis not present

## 2016-05-13 DIAGNOSIS — G301 Alzheimer's disease with late onset: Secondary | ICD-10-CM

## 2016-05-13 DIAGNOSIS — E43 Unspecified severe protein-calorie malnutrition: Secondary | ICD-10-CM | POA: Diagnosis not present

## 2016-05-13 DIAGNOSIS — K559 Vascular disorder of intestine, unspecified: Secondary | ICD-10-CM

## 2016-05-13 DIAGNOSIS — F22 Delusional disorders: Secondary | ICD-10-CM

## 2016-05-13 DIAGNOSIS — F39 Unspecified mood [affective] disorder: Secondary | ICD-10-CM

## 2016-06-03 DIAGNOSIS — S72011A Unspecified intracapsular fracture of right femur, initial encounter for closed fracture: Secondary | ICD-10-CM

## 2016-06-12 ENCOUNTER — Telehealth: Payer: Self-pay

## 2016-07-11 NOTE — Telephone Encounter (Signed)
PLEASE NOTE: All timestamps contained within this report are represented as [place] Standard Time. CONFIDENTIALTY NOTICE: This fax transmission is intended only for the addressee. It contains information that is legally privileged, confidential or otherwise protected from use or disclosure. If you are not the intended recipient, you are strictly prohibited from reviewing, disclosing, copying using or disseminating any of this information or taking any action in reliance on or regarding this information. If you have received this fax in error, please notify us immediately by telephone so that we can arrange for its return to us. Phone: [phone], Toll-Free: [phone], Fax: [phone] Page: 1 of 1 Call Id: [id number] [facility] - Client Nonclinical Telephone Record [facility] Client [facility] Day Physician Tillman AbideLetvak, Richard - MD Contact Type Call Who Is Calling Physician / Provider / Hospital Call Type Provider Call Message Only Reason for Call Request to speak to Physician Initial Comment Caller states from [facility] and patient has passed away. Patient Name Glen HolsteinCharles Frazier Patient DOB 1927/10/25 Requesting Provider Hilda LiasMarie Physician Number [phone] Facility [facility] Call Closed By: Gustavus MessingAlexandria Nunn Transaction Date/Time: 06/20/2016 1:54:10 AM (ET)

## 2016-07-11 NOTE — Telephone Encounter (Signed)
noted 

## 2016-07-11 DEATH — deceased

## 2017-08-27 IMAGING — CT CT ABD-PELV W/O CM
2 of 4 series · 15 of 46 positions shown, 17 images · non-contrast
Comparison: None.

CLINICAL DATA: Increased confusion and weakness. Found in bed this
morning covered involvement and feces. Dementia.

EXAM:
CT ABDOMEN AND PELVIS WITHOUT CONTRAST
TECHNIQUE: Multidetector CT imaging of the abdomen and pelvis was performed
following the standard protocol without IV contrast.

[Series 2: routine abd pel wo · axial · 0.71mm/px · z∈[-498,-82]mm · 12 of 95 slices shown, 14 images]
[im 8/95  soft-tissue]
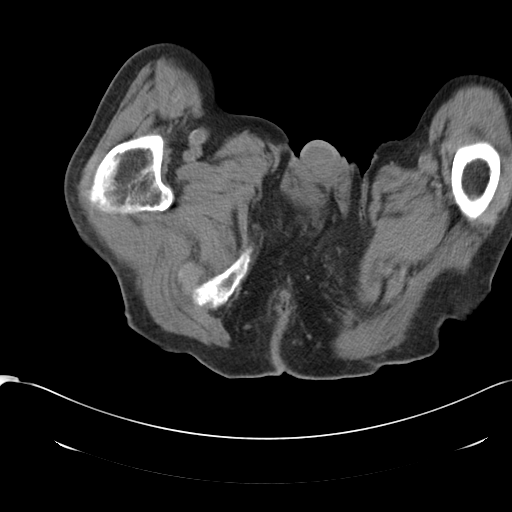
[im 8/95  bone]
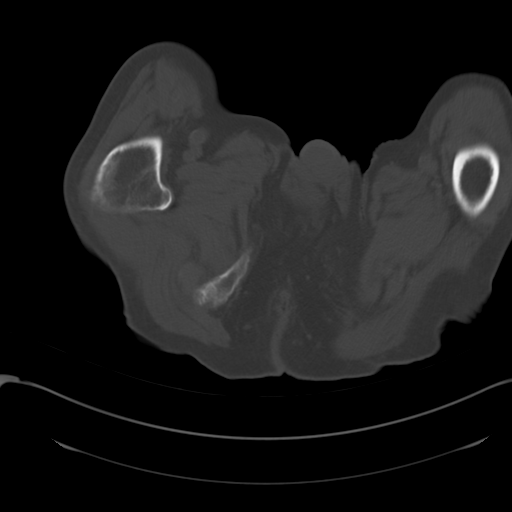
[im 16/95  soft-tissue]
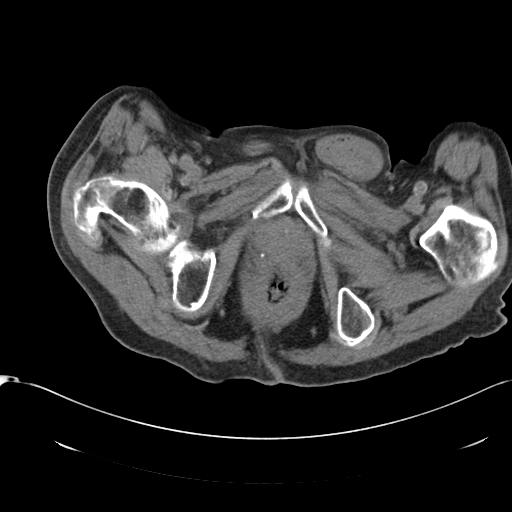
[im 23/95  soft-tissue]
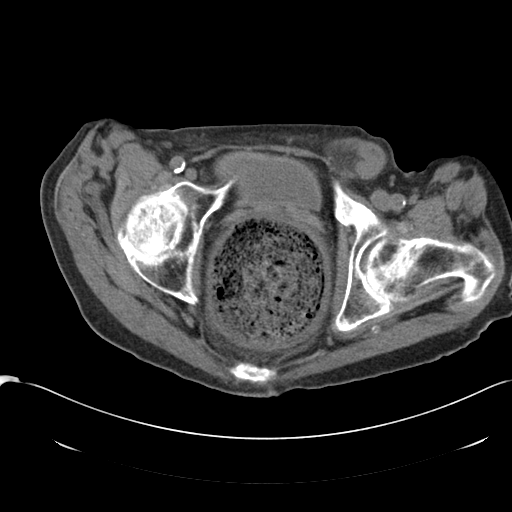
[im 31/95  soft-tissue]
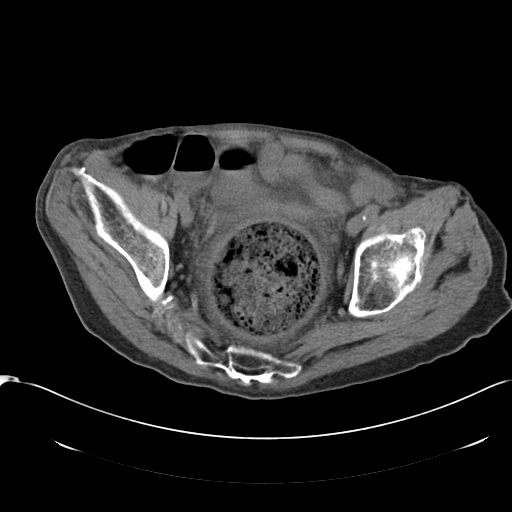
[im 38/95  soft-tissue]
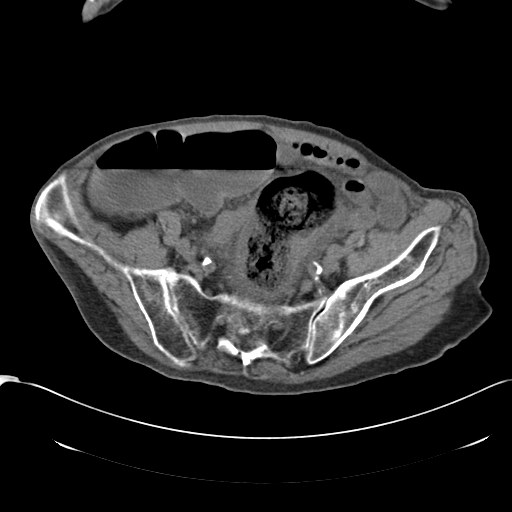
[im 46/95  soft-tissue]
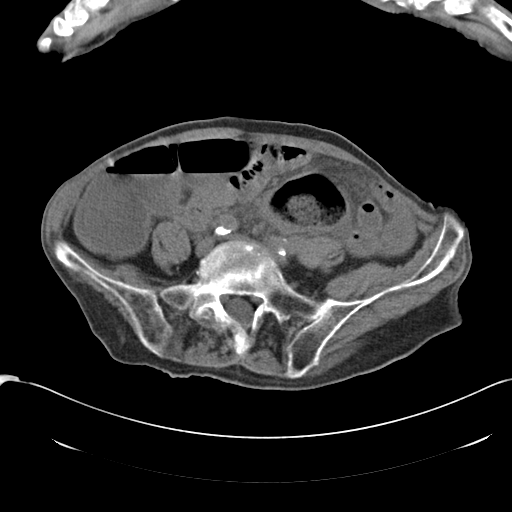
[im 53/95  soft-tissue]
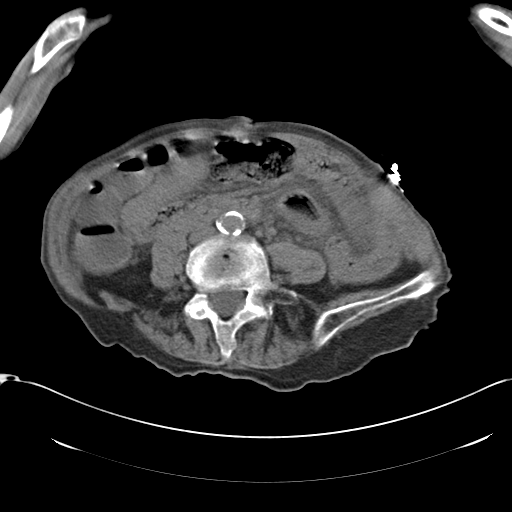
[im 61/95  soft-tissue]
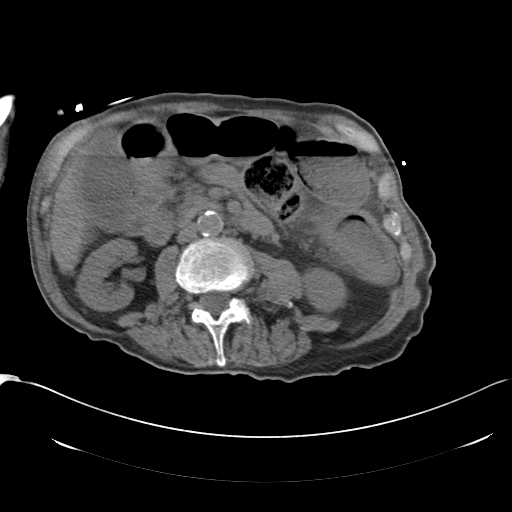
[im 68/95  soft-tissue]
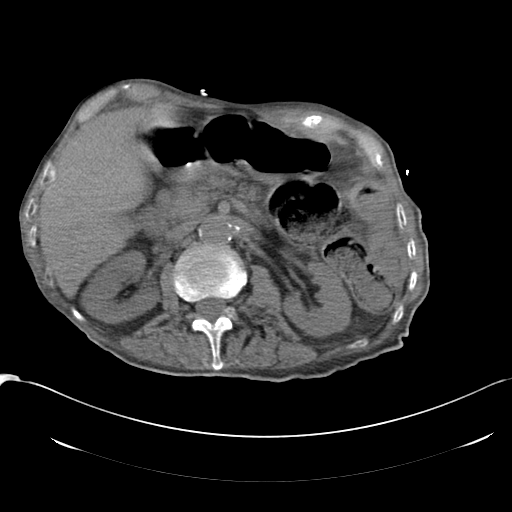
[im 68/95  bone]
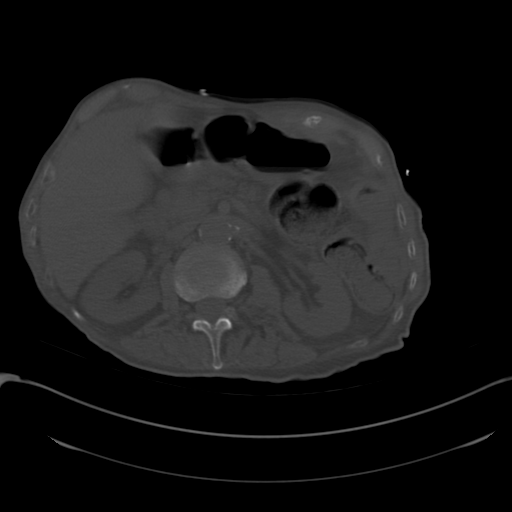
[im 76/95  soft-tissue]
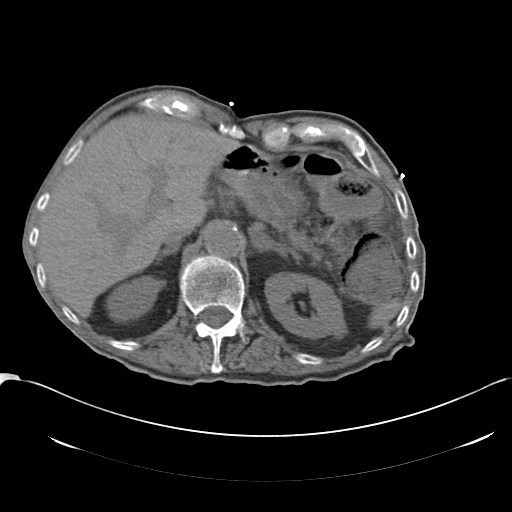
[im 83/95  soft-tissue]
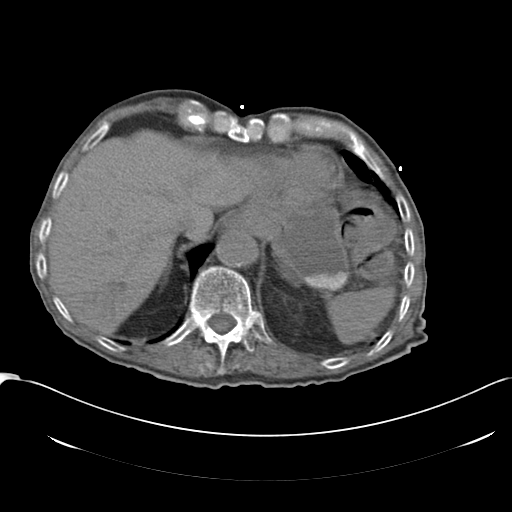
[im 91/95  soft-tissue]
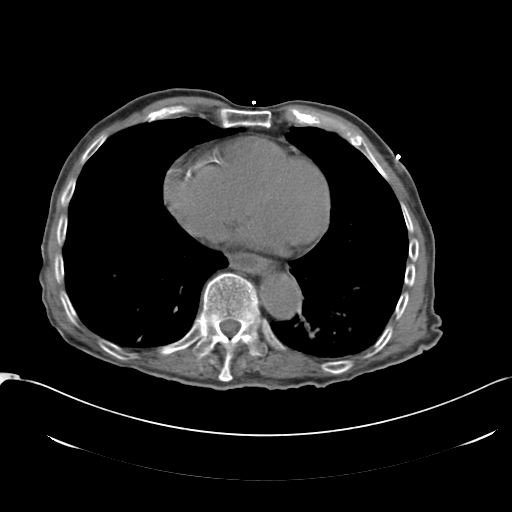

[Series 5: cor routine abd pel wo · coronal · 0.67mm/px · 3 of 119 slices shown]
[im 40/119  soft-tissue]
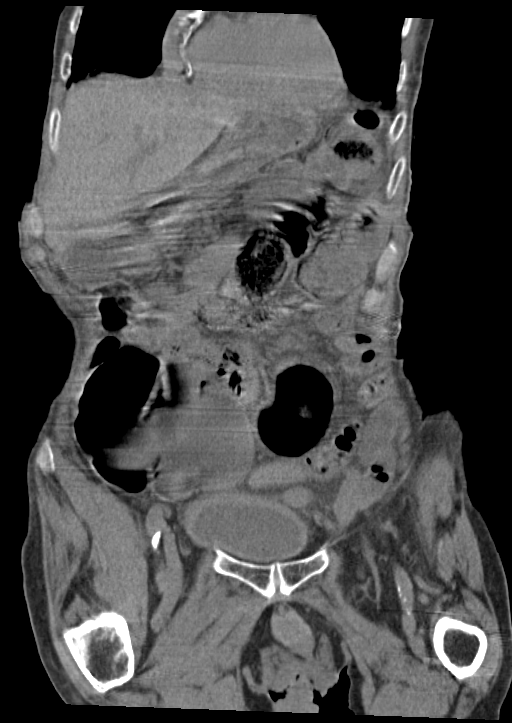
[im 53/119  soft-tissue]
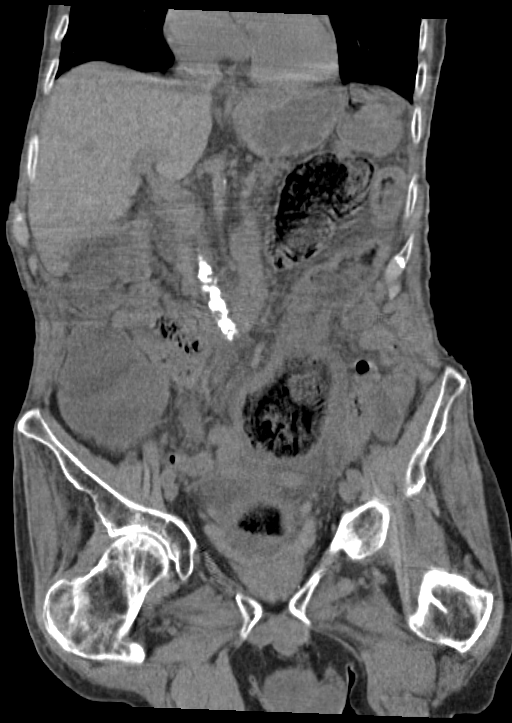
[im 66/119  soft-tissue]
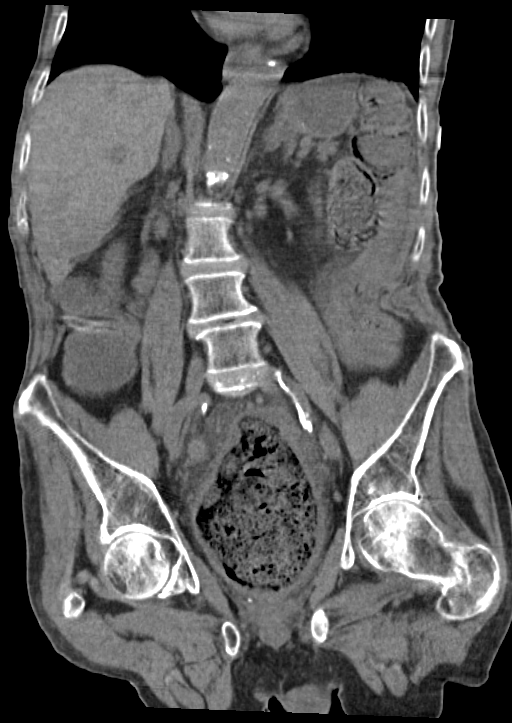

[15 of 46 positions shown; findings below may reference images not displayed]

FINDINGS: Lung bases demonstrate airspace opacification over the posterior
left lower lobe which may be due to pneumonia versus atelectasis.
Mild linear atelectasis/ scarring over the posterior right lower
lobe. Calcified plaque over the right coronary artery.

Abdominal images demonstrate the liver, spleen, pancreas,
gallbladder and adrenal glands to be within normal.

Kidneys are normal in size without hydronephrosis or
nephrolithiasis. Exophytic 1.3 cm hypodensity over the upper pole
left kidney with Hounsfield unit measurements 19 likely slightly
hyperdense cyst. Sub cm hypodensity over the lower pole left renal
cortex too small to characterize but likely a cyst. Ureters are
within normal.

There is moderate calcified plaque over the abdominal aorta and
iliac arteries.

Stomach is within normal. Small bowel is within normal. Appendix is
not visualized.

There is a redundant sigmoid colon with wall thickening and adjacent
inflammatory change of the sigmoid colon with this wall thickening
extending to the rectum with there is moderate distention fecal
retention/impaction. Findings likely due to an acute
colitis/proctitis. No evidence of obstruction or free peritoneal
air.

Remaining pelvic images demonstrate a left inguinal hernia
containing a short segment of small bowel. The bladder and prostate
are within normal. There is subacute fracture of the left inferior
pubic ramus. There are degenerate changes of the spine and hips.
IMPRESSION: Wall thickening with adjacent inflammatory change involving the
rectum and sigmoid colon likely an acute colitis/proctitis. Moderate
fecal retention/ impaction with distention of the rectum.

Left inguinal hernia containing a short segment of small bowel. No
incarceration or obstruction.

Airspace opacification over the left lower lobe which may be due to
a pneumonia. Linear scarring/atelectasis right lower lobe.

1.3 cm hypodensity over the mid to upper pole left kidney likely a
slightly hyperdense cyst. Sub cm hypodensity over the lower pole
left kidney too small to characterize but likely cyst.

Subacute fracture left inferior pubic ramus.
